# Patient Record
Sex: Female | Born: 1995 | Race: Black or African American | Hispanic: No | Marital: Single | Smoking: Never smoker
Health system: Southern US, Community
[De-identification: ages and names within clinical notes are randomized; demographics above are authoritative.]

## PROBLEM LIST (undated history)

## (undated) DIAGNOSIS — K219 Gastro-esophageal reflux disease without esophagitis: Secondary | ICD-10-CM

---

## 2016-05-21 ENCOUNTER — Encounter (HOSPITAL_COMMUNITY): Payer: Self-pay | Admitting: Emergency Medicine

## 2016-05-21 ENCOUNTER — Emergency Department (HOSPITAL_COMMUNITY)
Admission: EM | Admit: 2016-05-21 | Discharge: 2016-05-21 | Disposition: A | Discharge status: Home / Self Care | Payer: Medicaid Other | Attending: Dermatology | Admitting: Dermatology

## 2016-05-21 DIAGNOSIS — Z046 Encounter for general psychiatric examination, requested by authority: Secondary | ICD-10-CM | POA: Diagnosis present

## 2016-05-21 DIAGNOSIS — Z5321 Procedure and treatment not carried out due to patient leaving prior to being seen by health care provider: Secondary | ICD-10-CM | POA: Insufficient documentation

## 2016-05-21 HISTORY — DX: Gastro-esophageal reflux disease without esophagitis: K21.9

## 2016-05-21 NOTE — ED Notes (Signed)
Registration contacted Triage to inform RN pt and family decided to leave.

## 2016-05-21 NOTE — ED Triage Notes (Signed)
Mother states pt has become paranoid and anxious, thinks people are out to get here and steal her information. Police have been contacted, no evidence of a crime. Pt ran out of the house barefooted and into the woods yesterday. Police had to be contacted to bring pt back to home. Mother states pt has not been eating.

## 2016-05-22 ENCOUNTER — Encounter (HOSPITAL_COMMUNITY): Payer: Self-pay | Admitting: Emergency Medicine

## 2016-05-22 ENCOUNTER — Emergency Department (HOSPITAL_COMMUNITY)
Admission: EM | Admit: 2016-05-22 | Discharge: 2016-05-23 | Disposition: A | Discharge status: Other Acute Inpatient Hospital | Payer: Medicaid Other | Attending: Emergency Medicine | Admitting: Emergency Medicine

## 2016-05-22 DIAGNOSIS — Z79899 Other long term (current) drug therapy: Secondary | ICD-10-CM | POA: Insufficient documentation

## 2016-05-22 DIAGNOSIS — F22 Delusional disorders: Secondary | ICD-10-CM | POA: Diagnosis present

## 2016-05-22 DIAGNOSIS — F209 Schizophrenia, unspecified: Secondary | ICD-10-CM | POA: Insufficient documentation

## 2016-05-22 DIAGNOSIS — F29 Unspecified psychosis not due to a substance or known physiological condition: Secondary | ICD-10-CM

## 2016-05-22 LAB — RAPID URINE DRUG SCREEN, HOSP PERFORMED
AMPHETAMINES: NOT DETECTED
BENZODIAZEPINES: NOT DETECTED
Barbiturates: NOT DETECTED
Cocaine: NOT DETECTED
OPIATES: NOT DETECTED
Tetrahydrocannabinol: NOT DETECTED

## 2016-05-22 LAB — CBC WITH DIFFERENTIAL/PLATELET
Basophils Absolute: 0 10*3/uL (ref 0.0–0.1)
Basophils Relative: 0 %
EOS ABS: 0.2 10*3/uL (ref 0.0–0.7)
Eosinophils Relative: 3 %
HCT: 41.6 % (ref 36.0–46.0)
Hemoglobin: 13.7 g/dL (ref 12.0–15.0)
LYMPHS ABS: 2 10*3/uL (ref 0.7–4.0)
LYMPHS PCT: 28 %
MCH: 30.3 pg (ref 26.0–34.0)
MCHC: 32.9 g/dL (ref 30.0–36.0)
MCV: 92 fL (ref 78.0–100.0)
MONO ABS: 0.3 10*3/uL (ref 0.1–1.0)
MONOS PCT: 4 %
Neutro Abs: 4.5 10*3/uL (ref 1.7–7.7)
Neutrophils Relative %: 65 %
PLATELETS: 282 10*3/uL (ref 150–400)
RBC: 4.52 MIL/uL (ref 3.87–5.11)
RDW: 14.2 % (ref 11.5–15.5)
WBC: 7 10*3/uL (ref 4.0–10.5)

## 2016-05-22 LAB — COMPREHENSIVE METABOLIC PANEL WITH GFR
ALT: 16 U/L (ref 14–54)
AST: 31 U/L (ref 15–41)
Albumin: 4.8 g/dL (ref 3.5–5.0)
Alkaline Phosphatase: 37 U/L — ABNORMAL LOW (ref 38–126)
Anion gap: 9 (ref 5–15)
BUN: 11 mg/dL (ref 6–20)
CO2: 25 mmol/L (ref 22–32)
Calcium: 9.4 mg/dL (ref 8.9–10.3)
Chloride: 103 mmol/L (ref 101–111)
Creatinine, Ser: 0.79 mg/dL (ref 0.44–1.00)
GFR calc Af Amer: >60 mL/min
GFR calc non Af Amer: >60 mL/min
Glucose, Bld: 77 mg/dL (ref 65–99)
Potassium: 3.6 mmol/L (ref 3.5–5.1)
Sodium: 137 mmol/L (ref 135–145)
Total Bilirubin: 0.6 mg/dL (ref 0.3–1.2)
Total Protein: 8 g/dL (ref 6.5–8.1)

## 2016-05-22 LAB — ETHANOL: Alcohol, Ethyl (B): <5 mg/dL

## 2016-05-22 LAB — PREGNANCY, URINE: Preg Test, Ur: NEGATIVE

## 2016-05-22 MED ORDER — LORAZEPAM 1 MG PO TABS: 1.0000 mg | ORAL_TABLET | Freq: Three times a day (TID) | ORAL | Status: DC | PRN

## 2016-05-22 MED ORDER — LORAZEPAM 0.5 MG PO TABS
0.5000 mg | ORAL_TABLET | Freq: Three times a day (TID) | ORAL | Status: DC | PRN
  Administered 2016-05-22: 0.5 mg via ORAL
  Filled 2016-05-22: qty 1

## 2016-05-22 MED ORDER — ALUM & MAG HYDROXIDE-SIMETH 200-200-20 MG/5ML PO SUSP: 30.0000 mL | ORAL | Status: DC | PRN

## 2016-05-22 MED ORDER — ACETAMINOPHEN 325 MG PO TABS: 650.0000 mg | ORAL_TABLET | ORAL | Status: DC | PRN

## 2016-05-22 MED ORDER — IBUPROFEN 400 MG PO TABS: 600.0000 mg | ORAL_TABLET | Freq: Three times a day (TID) | ORAL | Status: DC | PRN

## 2016-05-22 NOTE — BH Assessment (Addendum)
Tele Assessment Note   Stacie FolkKaren Douglas is an 20 y.o. female, who presents voluntarily and unaccompanied to [facility]. Pt was a poor historian with psychosis. Pt could not recall the reason for her visit to [facility]. Clinican asked the pt if she has experienced, SI, or HI, clinician observed the pt's body begin to shake. Pt reported, she was "ok." Pt reported, she has been seeing shadows and people sometimes, for the past five or six days. Clinician read the pt information from the doctors note, pt agreed with information, pt "feels' someone is trying to get her information, and the other days she ran out of the house barefoot into the woods and was brought back home by police. Pt agreed, with the doctor note staying that demons were trying to get her. Pt denied current stressors. Pt denied SI, HI and self-injurious behaviors.   Per Dr. Clarene DukeMcManus' note: "Pt was seen at 1445. Per pt and her mother, c/o gradual onset and worsening of persistent "paranoia" and "anxiety" for the past 5 to 6 days. Pt's mother states pt has not been sleeping or eating. Pt "thinks people are out to get her and steal her information." Pt ran out of the house into the woods 2 days ago and Police brought pt home. Pt's mother [facility], has f/u appointment in January. Pt's mother states pt was "up all night yelling that a demon was trying to eat her." Pt also tried to get out of the moving car today. Pt denies SI/SA, states she "was just trying to get away."   Pt denied experiencing verbal, physical and sexual abuse. Pt denied being linked to OPT resources (medication management and/or counseling) however, per pt's doctor's note, [facility] and has an appointment scheduled for January 2018. Pt denied previous inpatient admissions.   Pt presented in scrubs with soft speech. Pt's eye cont was poor. Pt's mood was anxious. Pt's affect was flat. Pt's thought process was circumstantial. Pt's judgement was  impaired. Pt's concentration, insight and impulse control was poor. During the assessment the pt appeared to be responding to internal stimuli or experiencing delusional thought content. Pt reported, if discharged from [facility] she would contract for safety. Pt reported, if inpatient treatment is recommended she will sign in voluntarily.   Diagnosis: Unspecified schizophrenia spectrum and other psychotic disorder.   Past Medical History:  Past Medical History:  Diagnosis Date  . GERD (gastroesophageal reflux disease)     History reviewed. No pertinent surgical history.  Family History:  Family History  Problem Relation Age of Onset  . Autism Sister   . Autism Brother     Social History:  reports that she has never smoked. She has never used smokeless tobacco. She reports that she does not drink alcohol or use drugs.  Additional Social History:  Alcohol / Drug Use Pain Medications: See MAR  Prescriptions: See MAR Over the Counter: See MAR History of alcohol / drug use?: No history of alcohol / drug abuse  CIWA: CIWA-Ar BP: 105/74 Pulse Rate: 106 COWS:    PATIENT STRENGTHS: (choose at least two) Average or above average intelligence Supportive family/friends  Allergies: No Known Allergies  Home Medications:  (Not in a hospital admission)  OB/GYN Status:  Patient's last menstrual period was 05/19/2016.  General Assessment Data Location of Assessment: [facility] TTS Assessment: In system Is this a Tele or Face-to-Face Assessment?: Tele Assessment Is this an Initial Assessment or a Re-assessment for  this encounter?: Initial Assessment Marital status: Single Maiden name: NA Is patient pregnant?: No Pregnancy Status: No Living Arrangements: Parent Can pt return to current living arrangement?: Yes Admission Status: Voluntary Is patient capable of signing voluntary admission?: Yes Referral Source: Self/Family/Friend Insurance type: Medicaid     Crisis Care Plan Living  Arrangements: Parent Legal Guardian: Other: (Self) Name of Psychiatrist: NA Name of Therapist: NA  Education Status Is patient currently in school?: No Current Grade: NA Highest grade of school patient has completed: 11th grade Name of school: NA Contact person: NA  Risk to self with the past 6 months Suicidal Ideation: No (Pt denies. ) Has patient been a risk to self within the past 6 months prior to admission? : No Suicidal Intent: No Has patient had any suicidal intent within the past 6 months prior to admission? : No Is patient at risk for suicide?: No Suicidal Plan?: No Has patient had any suicidal plan within the past 6 months prior to admission? : No Access to Means: No What has been your use of drugs/alcohol within the last 12 months?: NA Previous Attempts/Gestures: No How many times?: 0 Other Self Harm Risks: NA Triggers for Past Attempts: None known Intentional Self Injurious Behavior: None (Pt denies. ) Family Suicide History: Unable to assess Recent stressful life event(s): Other (Comment) (Pt denies, stressors.) Persecutory voices/beliefs?: No Depression: No Substance abuse history and/or treatment for substance abuse?: No Suicide prevention information given to non-admitted patients: Not applicable  Risk to Others within the past 6 months Homicidal Ideation: No (Pt denies. ) Does patient have any lifetime risk of violence toward others beyond the six months prior to admission? : No Thoughts of Harm to Others: No Current Homicidal Intent: No Current Homicidal Plan: No Access to Homicidal Means: No Identified Victim: NA History of harm to others?: No Assessment of Violence: None Noted Violent Behavior Description: NA Does patient have access to weapons?: No (Pt denies. ) Criminal Charges Pending?: No (Pt denies. ) Does patient have a court date: No (Pt denies. ) Is patient on probation?: No (Pt denies. )  Psychosis Hallucinations: Visual Delusions:  Unspecified  Mental Status Report Appearance/Hygiene: In scrubs Eye Contact: Poor Motor Activity: Unremarkable Speech: Soft Level of Consciousness: Alert Mood: Anxious Affect: Flat Anxiety Level: Moderate Thought Processes: Circumstantial Judgement: Impaired Orientation: Other (Comment) (date, year, city and state) Obsessive Compulsive Thoughts/Behaviors: Unable to Assess  Cognitive Functioning Concentration: Poor Memory: Recent Impaired IQ: Average Insight: Poor Impulse Control: Poor Appetite: Poor Weight Loss: 0 Weight Gain: 0 Sleep: Decreased Total Hours of Sleep: 6 Vegetative Symptoms: None  ADLScreening [facility] Assessment Services) Patient's cognitive ability adequate to safely complete daily activities?: Yes Patient able to express need for assistance with ADLs?: Yes Independently performs ADLs?: Yes (appropriate for developmental age)  Prior Inpatient Therapy Prior Inpatient Therapy: No Prior Therapy Dates: NA Prior Therapy Facilty/Provider(s): NA Reason for Treatment: NA  Prior Outpatient Therapy Prior Outpatient Therapy: No Prior Therapy Dates: NA Prior Therapy Facilty/Provider(s): NA Reason for Treatment: NA Does patient have an ACCT team?: No Does patient have Intensive In-House Services?  : No Does patient have [facility] services? : No Does patient have P4CC services?: No  ADL Screening (condition at time of admission) Patient's cognitive ability adequate to safely complete daily activities?: Yes Is the patient deaf or have difficulty hearing?: No Does the patient have difficulty seeing, even when wearing glasses/contacts?: Yes (Pt wears glasses. ) Does the patient have difficulty concentrating, remembering, or making decisions?: Yes (  Pt has difficulty concentrating. ) Patient able to express need for assistance with ADLs?: Yes Does the patient have difficulty dressing or bathing?: No Independently performs ADLs?: Yes (appropriate for developmental  age) Does the patient have difficulty walking or climbing stairs?: No Weakness of Legs: None Weakness of Arms/Hands: None       Abuse/Neglect Assessment (Assessment to be complete while patient is alone) Physical Abuse: Denies (Pt denies. ) Verbal Abuse: Denies (Pt denies. ) Sexual Abuse: Denies (Pt denies. )     Advance Directives (For Healthcare) Does Patient Have a Medical Advance Directive?: No Would patient like information on creating a medical advance directive?: No - Patient declined    Additional Information 1:1 In Past 12 Months?: No CIRT Risk: No Elopement Risk: No Does patient have medical clearance?: Yes     Disposition: Nira ConnJason Berry, NP recommended inpatient treatment. Discussed with disposition with Barbara CowerJason, RN. Barbara CowerJason, RN reported, he will inform the doctor of the disposition. TTS to seek placement. Disposition Initial Assessment Completed for this Encounter: Yes Disposition of Patient: Inpatient treatment program Type of inpatient treatment program: Adult  Gwinda Passereylese D Bennett 05/22/2016 9:10 PM   Gwinda Passereylese D Bennett, MS, [facility], [facility] Triage Specialist [phone]

## 2016-05-22 NOTE — ED Provider Notes (Signed)
[facility]ER[facility]Y DEPT Provider Note   CSN: [id number] Arrival date & time: 05/22/16  1416     History   Chief Complaint Chief Complaint  Patient presents with  . V70.1    HPI Barrie FolkKaren Casasola is a 20 y.o. female.  HPI  Pt was seen at 1445. Per pt and her mother, c/o gradual onset and worsening of persistent "paranoia" and "anxiety" for the past 5 to 6 days. Pt's mother states pt has not been sleeping or eating. Pt "thinks people are out to get her and steal her information." Pt ran out of the house into the woods 2 days ago and Police brought pt home. Pt's mother [facility], has f/u appointment in January. Pt's mother states pt was "up all night yelling that a demon was trying to eat her." Pt also tried to get out of the moving car today. Pt denies SI/SA, states she "was just trying to get away."   Past Medical History:  Diagnosis Date  . GERD (gastroesophageal reflux disease)     There are no active problems to display for this patient.   History reviewed. No pertinent surgical history.  OB History    Gravida Para Term Preterm AB Living             0   SAB TAB Ectopic Multiple Live Births                   Home Medications    Prior to Admission medications   Not on File    Family History Family History  Problem Relation Age of Onset  . Autism Sister   . Autism Brother     Social History Social History  Substance Use Topics  . Smoking status: Never Smoker  . Smokeless tobacco: Never Used  . Alcohol use No     Allergies   Patient has no known allergies.   Review of Systems Review of Systems ROS: Statement: All systems negative except as marked or noted in the HPI; Constitutional: Negative for fever and chills. ; ; Eyes: Negative for eye pain, redness and discharge. ; ; ENMT: Negative for ear pain, hoarseness, nasal congestion, sinus pressure and sore throat. ; ; Cardiovascular: Negative for chest pain, palpitations,  diaphoresis, dyspnea and peripheral edema. ; ; Respiratory: Negative for cough, wheezing and stridor. ; ; Gastrointestinal: Negative for nausea, vomiting, diarrhea, abdominal pain, blood in stool, hematemesis, jaundice and rectal bleeding. . ; ; Genitourinary: Negative for dysuria, flank pain and hematuria. ; ; Musculoskeletal: Negative for back pain and neck pain. Negative for swelling and trauma.; ; Skin: Negative for pruritus, rash, abrasions, blisters, bruising and skin lesion.; ; Neuro: Negative for headache, lightheadedness and neck stiffness. Negative for weakness, altered level of consciousness, altered mental status, extremity weakness, paresthesias, involuntary movement, seizure and syncope.; Psych:  No SI, no SA, no HI, +paranoia, hallucinations.        Physical Exam Updated Vital Signs BP 105/74 (BP Location: Left Arm)   Pulse 106   Temp 97.8 F (36.6 C) (Oral)   Resp 16   Ht 5\' 4"  (1.626 m)   Wt 86 lb 4 oz (39.1 kg)   LMP 05/19/2016   SpO2 100%   BMI 14.80 kg/m   Physical Exam 1450: Physical examination:  Nursing notes reviewed; Vital signs and O2 SAT reviewed;  Constitutional: Well developed, Well nourished, Well hydrated, In no acute distress; Head:  Normocephalic, atraumatic; Eyes: EOMI, PERRL, No scleral icterus;  ENMT: Mouth and pharynx normal, Mucous membranes moist; Neck: Supple, Full range of motion; Cardiovascular: Regular rate and rhythm; Respiratory: Breath sounds clear, No wheezes.  Speaking full sentences with ease, Normal respiratory effort/excursion; Chest: No deformity, Movement normal; Abdomen: Nondistended; Extremities: No deformity.; Neuro: AA&Ox3, Major CN grossly intact.  Speech clear. No gross focal motor deficits in extremities. Climbs on and off stretcher easily by herself. Gait steady.; Skin: Color normal, Warm, Dry.; Psych:  Affect flat. Appears paranoid and to be responding to internal stimuli.   ED Treatments / Results  Labs (all labs ordered are  listed, but only abnormal results are displayed)   EKG  EKG Interpretation None       Radiology   Procedures Procedures (including critical care time)  Medications Ordered in ED Medications - No data to display   Initial Impression / Assessment and Plan / ED Course  I have reviewed the triage vital signs and the nursing notes.  Pertinent labs & imaging results that were available during my care of the patient were reviewed by me and considered in my medical decision making (see chart for details).  MDM Reviewed: nursing note, vitals and previous chart Interpretation: labs   Results for orders placed or performed during the hospital encounter of 05/22/16  Urine rapid drug screen (hosp performed)  Result Value Ref Range   Opiates NONE DETECTED NONE DETECTED   Cocaine NONE DETECTED NONE DETECTED   Benzodiazepines NONE DETECTED NONE DETECTED   Amphetamines NONE DETECTED NONE DETECTED   Tetrahydrocannabinol NONE DETECTED NONE DETECTED   Barbiturates NONE DETECTED NONE DETECTED  Pregnancy, urine  Result Value Ref Range   Preg Test, Ur NEGATIVE NEGATIVE  Comprehensive metabolic panel  Result Value Ref Range   Sodium 137 135 - 145 mmol/L   Potassium 3.6 3.5 - 5.1 mmol/L   Chloride 103 101 - 111 mmol/L   CO2 25 22 - 32 mmol/L   Glucose, Bld 77 65 - 99 mg/dL   BUN 11 6 - 20 mg/dL   Creatinine, Ser 1.61 0.44 - 1.00 mg/dL   Calcium 9.4 8.9 - 09.6 mg/dL   Total Protein 8.0 6.5 - 8.1 g/dL   Albumin 4.8 3.5 - 5.0 g/dL   AST 31 15 - 41 U/L   ALT 16 14 - 54 U/L   Alkaline Phosphatase 37 (L) 38 - 126 U/L   Total Bilirubin 0.6 0.3 - 1.2 mg/dL   GFR calc non Af Amer >60 >60 mL/min   GFR calc Af Amer >60 >60 mL/min   Anion gap 9 5 - 15  Ethanol  Result Value Ref Range   Alcohol, Ethyl (B) <5 <5 mg/dL  CBC with Differential  Result Value Ref Range   WBC 7.0 4.0 - 10.5 K/uL   RBC 4.52 3.87 - 5.11 MIL/uL   Hemoglobin 13.7 12.0 - 15.0 g/dL   HCT 04.5 40.9 - 81.1 %   MCV  92.0 78.0 - 100.0 fL   MCH 30.3 26.0 - 34.0 pg   MCHC 32.9 30.0 - 36.0 g/dL   RDW 91.4 78.2 - 95.6 %   Platelets 282 150 - 400 K/uL   Neutrophils Relative % 65 %   Neutro Abs 4.5 1.7 - 7.7 K/uL   Lymphocytes Relative 28 %   Lymphs Abs 2.0 0.7 - 4.0 K/uL   Monocytes Relative 4 %   Monocytes Absolute 0.3 0.1 - 1.0 K/uL   Eosinophils Relative 3 %   Eosinophils Absolute 0.2 0.0 - 0.7 K/uL  Basophils Relative 0 %   Basophils Absolute 0.0 0.0 - 0.1 K/uL    1945:  TTS to evaluate.   2100:  TTS has evaluated pt: recommends inpt treatment, placement pending. Pt attempting to leave. IVC paperwork completed. Holding orders written.     Final Clinical Impressions(s) / ED Diagnoses   Final diagnoses:  None    New Prescriptions New Prescriptions   No medications on file     Samuel JesterKathleen Taisia Fantini, DO 05/22/16 2119

## 2016-05-22 NOTE — ED Notes (Signed)
Notified Dr. Clarene DukeMcmanus that pt was attempting to leave.  Security currently standing at door preventing pt from leaving.  I instructed the patient to be calm and wait to speak with EDP.

## 2016-05-22 NOTE — ED Notes (Signed)
Pt given meal tray.

## 2016-05-22 NOTE — ED Notes (Signed)
[facility] and will try to find placement outside [facility] as they are at [facility].  If placement cannot be found tonight, Thurston Poundsrey says [facility] and bed [facility] sometime tomorrow.

## 2016-05-22 NOTE — ED Triage Notes (Signed)
[facility], has follow-up appt in Jan. Mother reports last night pt was up yelling, stating a demon was trying to eat her. [facility] the pt tried to get out of the car while it was moving. Pt refusing to eat, not sleeping.

## 2016-05-22 NOTE — ED Notes (Signed)
Mothers phone number:  Carney LivingJacqueline Douglas [phone]

## 2016-05-22 NOTE — ED Notes (Signed)
Patient could not use restroom. She will notify sitter when she can.

## 2016-05-22 NOTE — BHH Counselor (Signed)
At 2026, clinician called pt's mother to gather further information. Clinician left a HIPPA compliant message.   Gwinda Passereylese D Bennett, MS, [facility], [facility] Triage Specialist [phone]

## 2016-05-23 NOTE — ED Notes (Signed)
Attempted to contact patient's mother again at [phone] and [phone] without success.

## 2016-05-23 NOTE — ED Notes (Signed)
Pt provided with breakfast tray.  States "I feel good this morning."  Pt very withdrawn and quiet.  Does not speak much.  Provided with breakfast tray. Sitter at bedside.

## 2016-05-23 NOTE — ED Notes (Signed)
Attempted to contact patient's mother again at telephone [phone].  No answer at this time.

## 2016-05-23 NOTE — Progress Notes (Signed)
Reviewed chart, pt was assessed by TTS and recommended to receive inpatient treatment, referred to behavioral health hospitals. [facility] called and requested to know if pt has an autism dx "b/c 2 of her siblings do."   CSW called pt's mother Carney LivingJacqueline Wichmann [phone]. She states pt does not have autism and has never been dx with any mental health d/o prior to this incident. Does state 2 siblings are dx with autism, denies any other family hx of mental illness other than "maybe her grandparents as they got into their 10670s, had some problems." States pt has never seen a MH provider and is not prescribed medications for MG. Patient lives with mother and siblings.   [facility] to inform [facility]. Alecia LemmingVictor states [facility] is [facility] by Dr. Estill Cottahomas Cornwall, can arrive anytime and report # is [phone].  Informed [facility]. Pt under IVC. Informed pt's mother and provided contact number to [facility] so that family can be in touch with treatment team once pt admitted.  Ilean SkillMeghan Danial Hlavac, MSW, LCSW Clinical Social Work, Disposition  05/23/2016 [phone]

## 2016-05-23 NOTE — ED Notes (Signed)
Report given to Howard PouchVictor Lancaster at [facility].

## 2016-05-23 NOTE — BHH Counselor (Addendum)
Clinician received a call from Lakeland Northarol at admissions from [facility]. Okey RegalCarol wanted to know if pt was diagnosed with Autism. Clinician reported, she did not see a diagnoses of Autism in the pt's chart. Clinician reported, she has contacted the pt's mother earlier to gather additional and has not received a call back. Clinician contacted Darl PikesSusan, RN at [facility] to gather additional information it was noted from Darl PikesSusan, that she did not see an Autism diagnoses in pt's chart. Clinician discussed findings with Okey RegalCarol at [facility]. Okey RegalCarol, requested if pt's mother could be contacted to confirm or deny Autism diagnoses. Okey RegalCarol reported, she was going to hold a bed until the end of her shift at 0800.   Gwinda Passereylese D Bennett, MS, [facility], [facility] Triage Specialist [phone]

## 2016-05-23 NOTE — ED Provider Notes (Signed)
  Physical Exam  BP 122/77 (BP Location: Right Arm)   Pulse 94   Temp 97.5 F (36.4 C) (Oral)   Resp 20   Ht 5\' 4"  (1.626 m)   Wt 86 lb 4 oz (39.1 kg)   LMP 05/19/2016   SpO2 100%   BMI 14.80 kg/m   Physical Exam  ED Course  Procedures  MDM Accepted [facility] by Dr Benjiman Coreornwall       Kemaya Dorner, MD 05/23/16 [phone]

## 2016-05-23 NOTE — BHH Counselor (Signed)
Pt's referral was sent to the following inpatient facilities:   [facility]  TTS will continue to seek placement.   Gwinda Passereylese D Bennett, MS, [facility], [facility] Triage Specialist [phone]

## 2016-05-23 NOTE — ED Notes (Signed)
Attempted to call patient's mother, she did not answer at this time.  Patient has siblings that are diagnosed with autism and [facility] would like to know if the patient has the same diagnosed with the same.

## 2016-07-31 ENCOUNTER — Ambulatory Visit: Payer: Medicaid Other | Admitting: Family Medicine

## 2016-11-20 ENCOUNTER — Encounter (INDEPENDENT_AMBULATORY_CARE_PROVIDER_SITE_OTHER): Payer: Self-pay | Admitting: Ophthalmology

## 2016-12-11 ENCOUNTER — Encounter (INDEPENDENT_AMBULATORY_CARE_PROVIDER_SITE_OTHER): Payer: Self-pay | Admitting: Ophthalmology

## 2017-03-22 ENCOUNTER — Emergency Department (HOSPITAL_COMMUNITY): Payer: Medicaid Other

## 2017-03-22 ENCOUNTER — Inpatient Hospital Stay (HOSPITAL_COMMUNITY): Payer: Medicaid Other

## 2017-03-22 ENCOUNTER — Inpatient Hospital Stay (HOSPITAL_COMMUNITY)
Admission: EM | Admit: 2017-03-22 | Discharge: 2017-03-25 | DRG: 917 | Disposition: A | Discharge status: Other Acute Inpatient Hospital | Payer: Medicaid Other | Attending: Internal Medicine | Admitting: Internal Medicine

## 2017-03-22 ENCOUNTER — Encounter (HOSPITAL_COMMUNITY): Payer: Self-pay | Admitting: *Deleted

## 2017-03-22 DIAGNOSIS — G92 Toxic encephalopathy: Secondary | ICD-10-CM | POA: Diagnosis present

## 2017-03-22 DIAGNOSIS — J9600 Acute respiratory failure, unspecified whether with hypoxia or hypercapnia: Secondary | ICD-10-CM | POA: Diagnosis present

## 2017-03-22 DIAGNOSIS — R509 Fever, unspecified: Secondary | ICD-10-CM | POA: Diagnosis present

## 2017-03-22 DIAGNOSIS — E162 Hypoglycemia, unspecified: Secondary | ICD-10-CM | POA: Diagnosis present

## 2017-03-22 DIAGNOSIS — K219 Gastro-esophageal reflux disease without esophagitis: Secondary | ICD-10-CM | POA: Diagnosis present

## 2017-03-22 DIAGNOSIS — F17290 Nicotine dependence, other tobacco product, uncomplicated: Secondary | ICD-10-CM | POA: Diagnosis present

## 2017-03-22 DIAGNOSIS — T1491XA Suicide attempt, initial encounter: Secondary | ICD-10-CM | POA: Diagnosis not present

## 2017-03-22 DIAGNOSIS — R9431 Abnormal electrocardiogram [ECG] [EKG]: Secondary | ICD-10-CM | POA: Diagnosis present

## 2017-03-22 DIAGNOSIS — T450X1A Poisoning by antiallergic and antiemetic drugs, accidental (unintentional), initial encounter: Secondary | ICD-10-CM | POA: Diagnosis present

## 2017-03-22 DIAGNOSIS — T450X2A Poisoning by antiallergic and antiemetic drugs, intentional self-harm, initial encounter: Principal | ICD-10-CM | POA: Diagnosis present

## 2017-03-22 DIAGNOSIS — R569 Unspecified convulsions: Secondary | ICD-10-CM | POA: Diagnosis present

## 2017-03-22 DIAGNOSIS — I471 Supraventricular tachycardia: Secondary | ICD-10-CM | POA: Diagnosis present

## 2017-03-22 DIAGNOSIS — Z818 Family history of other mental and behavioral disorders: Secondary | ICD-10-CM

## 2017-03-22 DIAGNOSIS — Z810 Family history of intellectual disabilities: Secondary | ICD-10-CM | POA: Diagnosis not present

## 2017-03-22 DIAGNOSIS — D72829 Elevated white blood cell count, unspecified: Secondary | ICD-10-CM | POA: Diagnosis present

## 2017-03-22 DIAGNOSIS — F3130 Bipolar disorder, current episode depressed, mild or moderate severity, unspecified: Secondary | ICD-10-CM | POA: Diagnosis not present

## 2017-03-22 DIAGNOSIS — G4700 Insomnia, unspecified: Secondary | ICD-10-CM | POA: Diagnosis not present

## 2017-03-22 DIAGNOSIS — D649 Anemia, unspecified: Secondary | ICD-10-CM | POA: Diagnosis present

## 2017-03-22 DIAGNOSIS — J9601 Acute respiratory failure with hypoxia: Secondary | ICD-10-CM | POA: Diagnosis present

## 2017-03-22 DIAGNOSIS — F329 Major depressive disorder, single episode, unspecified: Secondary | ICD-10-CM | POA: Diagnosis present

## 2017-03-22 DIAGNOSIS — E876 Hypokalemia: Secondary | ICD-10-CM | POA: Diagnosis present

## 2017-03-22 DIAGNOSIS — T39312A Poisoning by propionic acid derivatives, intentional self-harm, initial encounter: Secondary | ICD-10-CM | POA: Diagnosis present

## 2017-03-22 DIAGNOSIS — T50904A Poisoning by unspecified drugs, medicaments and biological substances, undetermined, initial encounter: Secondary | ICD-10-CM

## 2017-03-22 DIAGNOSIS — E872 Acidosis: Secondary | ICD-10-CM | POA: Diagnosis present

## 2017-03-22 DIAGNOSIS — F39 Unspecified mood [affective] disorder: Secondary | ICD-10-CM | POA: Diagnosis not present

## 2017-03-22 LAB — PHOSPHORUS
Phosphorus: 1.8 mg/dL — ABNORMAL LOW (ref 2.5–4.6)
Phosphorus: 2.4 mg/dL — ABNORMAL LOW (ref 2.5–4.6)
Phosphorus: 2.2 mg/dL — ABNORMAL LOW (ref 2.5–4.6)

## 2017-03-22 LAB — GLUCOSE, CAPILLARY
GLUCOSE-CAPILLARY: 104 mg/dL — AB (ref 65–99)
GLUCOSE-CAPILLARY: 261 mg/dL — AB (ref 65–99)
GLUCOSE-CAPILLARY: 48 mg/dL — AB (ref 65–99)
Glucose-Capillary: 111 mg/dL — ABNORMAL HIGH (ref 65–99)
Glucose-Capillary: 163 mg/dL — ABNORMAL HIGH (ref 65–99)
Glucose-Capillary: 41 mg/dL — CL (ref 65–99)
Glucose-Capillary: 63 mg/dL — ABNORMAL LOW (ref 65–99)
Glucose-Capillary: 65 mg/dL (ref 65–99)
Glucose-Capillary: 68 mg/dL (ref 65–99)
Glucose-Capillary: 99 mg/dL (ref 65–99)

## 2017-03-22 LAB — COMPREHENSIVE METABOLIC PANEL
ALBUMIN: 3.9 g/dL (ref 3.5–5.0)
ALK PHOS: 44 U/L (ref 38–126)
ALT: 12 U/L — ABNORMAL LOW (ref 14–54)
ALT: 14 U/L (ref 14–54)
ANION GAP: 16 — AB (ref 5–15)
AST: 29 U/L (ref 15–41)
AST: 26 U/L (ref 15–41)
Albumin: 4.2 g/dL (ref 3.5–5.0)
Alkaline Phosphatase: 44 U/L (ref 38–126)
Anion gap: 8 (ref 5–15)
BUN: 6 mg/dL (ref 6–20)
CHLORIDE: 102 mmol/L (ref 101–111)
CHLORIDE: 107 mmol/L (ref 101–111)
CO2: 24 mmol/L (ref 22–32)
CO2: 24 mmol/L (ref 22–32)
CREATININE: 0.77 mg/dL (ref 0.44–1.00)
Calcium: 8.4 mg/dL — ABNORMAL LOW (ref 8.9–10.3)
Calcium: 7.8 mg/dL — ABNORMAL LOW (ref 8.9–10.3)
Creatinine, Ser: 0.72 mg/dL (ref 0.44–1.00)
GFR calc non Af Amer: >60 mL/min (ref >60)
GLUCOSE: 123 mg/dL — AB (ref 65–99)
Glucose, Bld: 104 mg/dL — ABNORMAL HIGH (ref 65–99)
POTASSIUM: 3.4 mmol/L — AB (ref 3.5–5.1)
Potassium: 3.4 mmol/L — ABNORMAL LOW (ref 3.5–5.1)
SODIUM: 142 mmol/L (ref 135–145)
SODIUM: 139 mmol/L (ref 135–145)
TOTAL PROTEIN: 7 g/dL (ref 6.5–8.1)
Total Bilirubin: 0.2 mg/dL — ABNORMAL LOW (ref 0.3–1.2)
Total Bilirubin: 0.7 mg/dL (ref 0.3–1.2)
Total Protein: 6.7 g/dL (ref 6.5–8.1)

## 2017-03-22 LAB — I-STAT CHEM 8, ED
BUN: <3 mg/dL — ABNORMAL LOW (ref 6–20)
CREATININE: 0.5 mg/dL (ref 0.44–1.00)
Calcium, Ion: 0.95 mmol/L — ABNORMAL LOW (ref 1.15–1.40)
Chloride: 99 mmol/L — ABNORMAL LOW (ref 101–111)
Glucose, Bld: 125 mg/dL — ABNORMAL HIGH (ref 65–99)
HEMATOCRIT: 37 % (ref 36.0–46.0)
HEMOGLOBIN: 12.6 g/dL (ref 12.0–15.0)
POTASSIUM: 3.2 mmol/L — AB (ref 3.5–5.1)
SODIUM: 143 mmol/L (ref 135–145)
TCO2: 25 mmol/L (ref 22–32)

## 2017-03-22 LAB — CBC WITH DIFFERENTIAL/PLATELET
BASOS PCT: 0 %
Basophils Absolute: 0 10*3/uL (ref 0.0–0.1)
Basophils Absolute: 0 10*3/uL (ref 0.0–0.1)
Basophils Relative: 0 %
EOS ABS: 0.1 10*3/uL (ref 0.0–0.7)
EOS PCT: 1 %
Eosinophils Absolute: 0 10*3/uL (ref 0.0–0.7)
Eosinophils Relative: 0 %
HCT: 37.1 % (ref 36.0–46.0)
HEMATOCRIT: 36.8 % (ref 36.0–46.0)
HEMOGLOBIN: 12.7 g/dL (ref 12.0–15.0)
HEMOGLOBIN: 12.8 g/dL (ref 12.0–15.0)
LYMPHS ABS: 1.7 10*3/uL (ref 0.7–4.0)
LYMPHS ABS: 1.1 10*3/uL (ref 0.7–4.0)
LYMPHS PCT: 18 %
Lymphocytes Relative: 9 %
MCH: 30.2 pg (ref 26.0–34.0)
MCH: 31.1 pg (ref 26.0–34.0)
MCHC: 34.2 g/dL (ref 30.0–36.0)
MCHC: 34.8 g/dL (ref 30.0–36.0)
MCV: 88.1 fL (ref 78.0–100.0)
MCV: 89.3 fL (ref 78.0–100.0)
MONOS PCT: 3 %
MONOS PCT: 6 %
Monocytes Absolute: 0.3 10*3/uL (ref 0.1–1.0)
Monocytes Absolute: 0.8 10*3/uL (ref 0.1–1.0)
NEUTROS ABS: 10.5 10*3/uL — AB (ref 1.7–7.7)
NEUTROS PCT: 85 %
Neutro Abs: 7.2 10*3/uL (ref 1.7–7.7)
Neutrophils Relative %: 78 %
PLATELETS: 235 10*3/uL (ref 150–400)
Platelets: 230 10*3/uL (ref 150–400)
RBC: 4.21 MIL/uL (ref 3.87–5.11)
RBC: 4.12 MIL/uL (ref 3.87–5.11)
RDW: 12.6 % (ref 11.5–15.5)
RDW: 13 % (ref 11.5–15.5)
WBC: 9.2 10*3/uL (ref 4.0–10.5)
WBC: 12.4 10*3/uL — ABNORMAL HIGH (ref 4.0–10.5)

## 2017-03-22 LAB — BLOOD GAS, ARTERIAL
ACID-BASE DEFICIT: 1 mmol/L (ref 0.0–2.0)
Acid-Base Excess: 0.7 mmol/L (ref 0.0–2.0)
Bicarbonate: 24.6 mmol/L (ref 20.0–28.0)
Bicarbonate: 22.7 mmol/L (ref 20.0–28.0)
DRAWN BY: 382351
DRAWN BY: 505221
FIO2: 100
FIO2: 35
MECHVT: 400 mL
O2 SAT: 99.4 %
O2 Saturation: 99.7 %
PCO2 ART: 47.6 mmHg (ref 32.0–48.0)
PEEP/CPAP: 5 cmH2O
PEEP: 5 cmH2O
PH ART: 7.43 (ref 7.350–7.450)
PO2 ART: 185 mmHg — AB (ref 83.0–108.0)
Patient temperature: 37
Patient temperature: 98.6
RATE: 16 resp/min
RATE: 16 resp/min
VT: 400 mL
pCO2 arterial: 34.8 mmHg (ref 32.0–48.0)
pH, Arterial: 7.351 (ref 7.350–7.450)

## 2017-03-22 LAB — TROPONIN I
Troponin I: <0.03 ng/mL (ref <0.03)
Troponin I: 0.03 ng/mL (ref <0.03)
Troponin I: <0.03 ng/mL (ref <0.03)

## 2017-03-22 LAB — URINALYSIS, ROUTINE W REFLEX MICROSCOPIC
Bilirubin Urine: NEGATIVE
Glucose, UA: NEGATIVE mg/dL
Hgb urine dipstick: NEGATIVE
KETONES UR: NEGATIVE mg/dL
LEUKOCYTES UA: NEGATIVE
NITRITE: NEGATIVE
PROTEIN: NEGATIVE mg/dL
Specific Gravity, Urine: 1.008 (ref 1.005–1.030)
pH: 6 (ref 5.0–8.0)

## 2017-03-22 LAB — RAPID URINE DRUG SCREEN, HOSP PERFORMED
Amphetamines: NOT DETECTED
BARBITURATES: NOT DETECTED
BENZODIAZEPINES: NOT DETECTED
Cocaine: NOT DETECTED
OPIATES: NOT DETECTED
Tetrahydrocannabinol: NOT DETECTED

## 2017-03-22 LAB — MAGNESIUM
MAGNESIUM: 1.8 mg/dL (ref 1.7–2.4)
MAGNESIUM: 2.3 mg/dL (ref 1.7–2.4)
MAGNESIUM: 2.2 mg/dL (ref 1.7–2.4)
MAGNESIUM: 2.1 mg/dL (ref 1.7–2.4)

## 2017-03-22 LAB — I-STAT CG4 LACTIC ACID, ED: Lactic Acid, Venous: 9.29 mmol/L (ref 0.5–1.9)

## 2017-03-22 LAB — ETHANOL: Alcohol, Ethyl (B): <10 mg/dL (ref <10)

## 2017-03-22 LAB — I-STAT BETA HCG BLOOD, ED (MC, WL, AP ONLY): I-stat hCG, quantitative: <5 m[IU]/mL (ref <5)

## 2017-03-22 LAB — ACETAMINOPHEN LEVEL

## 2017-03-22 LAB — TRIGLYCERIDES: TRIGLYCERIDES: 40 mg/dL (ref <150)

## 2017-03-22 LAB — PROTIME-INR
INR: 1.13
Prothrombin Time: 14.4 seconds (ref 11.4–15.2)

## 2017-03-22 LAB — CK: CK TOTAL: 53 U/L (ref 38–234)

## 2017-03-22 LAB — SALICYLATE LEVEL

## 2017-03-22 LAB — LACTIC ACID, PLASMA
Lactic Acid, Venous: 2.7 mmol/L (ref 0.5–1.9)
Lactic Acid, Venous: 2 mmol/L (ref 0.5–1.9)

## 2017-03-22 LAB — MRSA PCR SCREENING: MRSA BY PCR: NEGATIVE

## 2017-03-22 MED ORDER — PROPOFOL 1000 MG/100ML IV EMUL
INTRAVENOUS | Status: AC
  Administered 2017-03-22: 5 ug/kg/min via INTRAVENOUS
  Filled 2017-03-22: qty 100

## 2017-03-22 MED ORDER — HEPARIN SODIUM (PORCINE) 5000 UNIT/ML IJ SOLN
5000.0000 [IU] | Freq: Three times a day (TID) | INTRAMUSCULAR | Status: DC
  Administered 2017-03-22 – 2017-03-25 (×10): 5000 [IU] via SUBCUTANEOUS
  Filled 2017-03-22 (×10): qty 1

## 2017-03-22 MED ORDER — ETOMIDATE 2 MG/ML IV SOLN
INTRAVENOUS | Status: AC | PRN
  Administered 2017-03-22: 20 mg via INTRAVENOUS

## 2017-03-22 MED ORDER — MIDAZOLAM HCL 2 MG/2ML IJ SOLN: 2.0000 mg | INTRAMUSCULAR | Status: DC | PRN

## 2017-03-22 MED ORDER — FENTANYL CITRATE (PF) 100 MCG/2ML IJ SOLN
100.0000 ug | INTRAMUSCULAR | Status: DC | PRN
  Administered 2017-03-23 (×3): 100 ug via INTRAVENOUS
  Filled 2017-03-22 (×4): qty 2

## 2017-03-22 MED ORDER — FAMOTIDINE IN NACL 20-0.9 MG/50ML-% IV SOLN
20.0000 mg | Freq: Two times a day (BID) | INTRAVENOUS | Status: DC
  Administered 2017-03-22 – 2017-03-23 (×3): 20 mg via INTRAVENOUS
  Filled 2017-03-22 (×3): qty 50

## 2017-03-22 MED ORDER — FENTANYL CITRATE (PF) 100 MCG/2ML IJ SOLN: 100.0000 ug | INTRAMUSCULAR | Status: DC | PRN

## 2017-03-22 MED ORDER — LEVETIRACETAM IN NACL 1000 MG/100ML IV SOLN
1000.0000 mg | Freq: Once | INTRAVENOUS | Status: AC
  Administered 2017-03-22: 1000 mg via INTRAVENOUS
  Filled 2017-03-22: qty 100

## 2017-03-22 MED ORDER — DEXTROSE-NACL 5-0.9 % IV SOLN
INTRAVENOUS | Status: DC
  Administered 2017-03-22 – 2017-03-23 (×3): via INTRAVENOUS

## 2017-03-22 MED ORDER — LORAZEPAM 2 MG/ML IJ SOLN
INTRAMUSCULAR | Status: AC
  Administered 2017-03-22: 2 mg via INTRAVENOUS
  Filled 2017-03-22: qty 1

## 2017-03-22 MED ORDER — SODIUM CHLORIDE 0.9 % IV SOLN
INTRAVENOUS | Status: DC
  Administered 2017-03-22: 08:00:00 via INTRAVENOUS

## 2017-03-22 MED ORDER — ACETAMINOPHEN 325 MG PO TABS: 650.0000 mg | ORAL_TABLET | ORAL | Status: DC | PRN

## 2017-03-22 MED ORDER — CHLORHEXIDINE GLUCONATE 0.12 % MT SOLN
15.0000 mL | Freq: Two times a day (BID) | OROMUCOSAL | Status: DC
  Administered 2017-03-23: 15 mL via OROMUCOSAL
  Filled 2017-03-22: qty 15

## 2017-03-22 MED ORDER — DEXTROSE 50 % IV SOLN
50.0000 mL | Freq: Once | INTRAVENOUS | Status: AC
  Administered 2017-03-22: 50 mL via INTRAVENOUS

## 2017-03-22 MED ORDER — DEXTROSE 50 % IV SOLN
INTRAVENOUS | Status: AC
  Filled 2017-03-22: qty 50

## 2017-03-22 MED ORDER — PROPOFOL 1000 MG/100ML IV EMUL
5.0000 ug/kg/min | Freq: Once | INTRAVENOUS | Status: AC
  Administered 2017-03-22: 5 ug/kg/min via INTRAVENOUS

## 2017-03-22 MED ORDER — FENTANYL CITRATE (PF) 100 MCG/2ML IJ SOLN
100.0000 ug | INTRAMUSCULAR | Status: AC | PRN
  Administered 2017-03-22 (×3): 100 ug via INTRAVENOUS
  Filled 2017-03-22 (×2): qty 2

## 2017-03-22 MED ORDER — POTASSIUM CHLORIDE 10 MEQ/100ML IV SOLN
INTRAVENOUS | Status: AC
  Filled 2017-03-22: qty 100

## 2017-03-22 MED ORDER — VITAL HIGH PROTEIN PO LIQD
1000.0000 mL | ORAL | Status: DC
  Administered 2017-03-22: 1000 mL

## 2017-03-22 MED ORDER — FENTANYL CITRATE (PF) 100 MCG/2ML IJ SOLN
100.0000 ug | INTRAMUSCULAR | Status: DC | PRN
Start: 2017-03-22 — End: 2017-03-22

## 2017-03-22 MED ORDER — ASPIRIN 81 MG PO CHEW: 324.0000 mg | CHEWABLE_TABLET | ORAL | Status: DC

## 2017-03-22 MED ORDER — SODIUM BICARBONATE 8.4 % IV SOLN: 100.0000 meq | Freq: Once | INTRAVENOUS | Status: DC

## 2017-03-22 MED ORDER — MAGNESIUM SULFATE 2 GM/50ML IV SOLN
2.0000 g | Freq: Once | INTRAVENOUS | Status: AC
  Administered 2017-03-22: 2 g via INTRAVENOUS
  Filled 2017-03-22: qty 50

## 2017-03-22 MED ORDER — LORAZEPAM 2 MG/ML IJ SOLN
2.0000 mg | Freq: Once | INTRAMUSCULAR | Status: AC
  Administered 2017-03-22: 2 mg via INTRAVENOUS

## 2017-03-22 MED ORDER — DEXTROSE 50 % IV SOLN
INTRAVENOUS | Status: AC
  Administered 2017-03-22: 25 mL
  Filled 2017-03-22: qty 50

## 2017-03-22 MED ORDER — SODIUM CHLORIDE 0.9 % IV SOLN: 250.0000 mL | INTRAVENOUS | Status: DC | PRN

## 2017-03-22 MED ORDER — POTASSIUM CHLORIDE 10 MEQ/100ML IV SOLN
10.0000 meq | INTRAVENOUS | Status: AC
  Administered 2017-03-22 (×6): 10 meq via INTRAVENOUS
  Filled 2017-03-22 (×5): qty 100

## 2017-03-22 MED ORDER — CHLORHEXIDINE GLUCONATE 0.12% ORAL RINSE (MEDLINE KIT)
15.0000 mL | Freq: Two times a day (BID) | OROMUCOSAL | Status: DC
  Administered 2017-03-22 (×2): 15 mL via OROMUCOSAL

## 2017-03-22 MED ORDER — DEXTROSE 50 % IV SOLN
25.0000 mL | Freq: Once | INTRAVENOUS | Status: AC
  Administered 2017-03-22: 25 mL via INTRAVENOUS

## 2017-03-22 MED ORDER — PRO-STAT SUGAR FREE PO LIQD
30.0000 mL | Freq: Two times a day (BID) | ORAL | Status: DC
  Administered 2017-03-22 – 2017-03-23 (×2): 30 mL
  Filled 2017-03-22 (×2): qty 30

## 2017-03-22 MED ORDER — ALBUTEROL SULFATE (2.5 MG/3ML) 0.083% IN NEBU: 2.5000 mg | INHALATION_SOLUTION | RESPIRATORY_TRACT | Status: DC | PRN

## 2017-03-22 MED ORDER — ORAL CARE MOUTH RINSE
15.0000 mL | Freq: Four times a day (QID) | OROMUCOSAL | Status: DC
  Administered 2017-03-22 (×2): 15 mL via OROMUCOSAL

## 2017-03-22 MED ORDER — ORAL CARE MOUTH RINSE
15.0000 mL | OROMUCOSAL | Status: DC
  Administered 2017-03-22 – 2017-03-23 (×11): 15 mL via OROMUCOSAL

## 2017-03-22 MED ORDER — SODIUM CHLORIDE 0.9 % IV BOLUS (SEPSIS)
1000.0000 mL | Freq: Once | INTRAVENOUS | Status: AC
  Administered 2017-03-22: 1000 mL via INTRAVENOUS

## 2017-03-22 MED ORDER — DEXTROSE 50 % IV SOLN
1.0000 | Freq: Once | INTRAVENOUS | Status: AC
  Administered 2017-03-22: 50 mL via INTRAVENOUS

## 2017-03-22 MED ORDER — PROPOFOL 1000 MG/100ML IV EMUL
0.0000 ug/kg/min | INTRAVENOUS | Status: DC
Start: 2017-03-22 — End: 2017-03-23
  Administered 2017-03-22 (×3): 45 ug/kg/min via INTRAVENOUS
  Administered 2017-03-23: 50 ug/kg/min via INTRAVENOUS
  Filled 2017-03-22 (×3): qty 100

## 2017-03-22 MED ORDER — CHLORHEXIDINE GLUCONATE 0.12% ORAL RINSE (MEDLINE KIT): 15.0000 mL | Freq: Two times a day (BID) | OROMUCOSAL | Status: DC

## 2017-03-22 MED ORDER — SUCCINYLCHOLINE CHLORIDE 20 MG/ML IJ SOLN
INTRAMUSCULAR | Status: AC | PRN
  Administered 2017-03-22: 100 mg via INTRAVENOUS

## 2017-03-22 MED ORDER — DOCUSATE SODIUM 50 MG/5ML PO LIQD: 100.0000 mg | Freq: Two times a day (BID) | ORAL | Status: DC | PRN

## 2017-03-22 MED ORDER — SODIUM BICARBONATE 8.4 % IV SOLN
INTRAVENOUS | Status: DC | PRN
  Administered 2017-03-22 (×2): 50 meq via INTRAVENOUS

## 2017-03-22 MED ORDER — ASPIRIN 300 MG RE SUPP: 300.0000 mg | RECTAL | Status: DC

## 2017-03-22 NOTE — Progress Notes (Signed)
Dr. Merlene PullingHammonds notified of lactic acid results.

## 2017-03-22 NOTE — ED Notes (Signed)
Pt had full body seizure with nursing staff, pt rolled to side, suction and oxygen applied. Dr Manus Gunningancour notified,

## 2017-03-22 NOTE — Progress Notes (Signed)
PULMONARY / CRITICAL CARE MEDICINE   Name: Barrie FolkKaren Lyall MRN: [id number] DOB: 12/07/1995    ADMISSION DATE:  03/22/2017  CHIEF COMPLAINT:  Overdose, Seizure  HISTORY OF PRESENT ILLNESS:   21yoF with hx GERD, presents today as a transfer from [facility] where she presented this morning after being found down in her house. Her parents say they last saw her normal on 10/21 at 1:30am. At approx 5:30am they found her on the floor unresponsive next to an empty bottle of benadryl. EMS was called and reportedly she had a seizure with EMS as well as another seizure at the outside hospital ER. When she first presented to them she had tachycardia in 170's with wide QRS and prolonged QT. She was given Magnesium and Bicarb with improvement of both. She was intubated and transferred to [facility]. At the time of my exam, her family has not yet arrived at the hospital. Per the outside hospital report, the family reports they think she took 70 tabs of benadryl; in addition an ibuprofen bottle was missing.   PAST MEDICAL HISTORY :  She  has a past medical history of GERD (gastroesophageal reflux disease).  PAST SURGICAL HISTORY: She  has no past surgical history on file.  No Known Allergies  No current facility-administered medications on file prior to encounter.    Current Outpatient Prescriptions on File Prior to Encounter  Medication Sig  . Multiple Vitamin (MULTIVITAMIN) tablet Take 1 tablet by mouth daily.  . Omega-3 Fatty Acids (FISH OIL) 1000 MG CAPS Take 1 capsule by mouth daily.   FAMILY HISTORY:  Her indicated that the status of her sister is unknown. She indicated that the status of her brother is unknown.   SOCIAL HISTORY: She  reports that she has never smoked. She has never used smokeless tobacco. She reports that she does not drink alcohol or use drugs.  REVIEW OF SYSTEMS:   Review of Systems  Unable to perform ROS: Critical illness   SUBJECTIVE:  Intubated and sedated    VITAL SIGNS: BP 128/88   Pulse (!) 107   Temp (!) 97.4 F (36.3 C) (Oral)   Resp 19   Ht 5\' 2"  (1.575 m)   Wt 40.8 kg (90 lb)   SpO2 100%   BMI 16.46 kg/m   VENTILATOR SETTINGS: Vent Mode: PRVC FiO2 (%):  [35 %-100 %] 35 % Set Rate:  [16 bmp] 16 bmp Vt Set:  [400 mL] 400 mL PEEP:  [5 cmH20] 5 cmH20 Plateau Pressure:  [13 cmH20] 13 cmH20  INTAKE / OUTPUT: No intake/output data recorded.  PHYSICAL EXAMINATION: General: young thin female, intubated and sedated, critically ill Neuro: sedated, dilated 4mm pupils are equal and reactive to light, coughs to suctioning, not obeying commands. No ETT secretions to suctioning.  HEENT: OP clear, MM moist, Orally intubated Cardiovascular: RRR , no m/r/g, HR 89 Lungs: CTA b/l Abdomen: soft NTND, BS+ Musculoskeletal: no LE edema Skin: no rashes or abrasions  LABS:  BMET  Recent Labs Lab 03/22/17 0620 03/22/17 0638  NA 142 143  K 3.4* 3.2*  CL 102 99*  CO2 24  --   BUN 6 <3*  CREATININE 0.72 0.50  GLUCOSE 123* 125*   Electrolytes  Recent Labs Lab 03/22/17 0620  CALCIUM 8.4*  MG 1.8   CBC  Recent Labs Lab 03/22/17 0620 03/22/17 0638  WBC 9.2  --   HGB 12.7 12.6  HCT 37.1 37.0  PLT 235  --  Coag's No results for input(s): APTT, INR in the last 168 hours.  Sepsis Markers  Recent Labs Lab 03/22/17 0633  LATICACIDVEN 9.29*   ABG  Recent Labs Lab 03/22/17 0610  PHART 7.351  PCO2ART 47.6  PO2ART RBV ABOVE REPORTABLE RANGE C.[place],RN BY B.SMITH,RRT,RCP ON 03/22/17 AT 06:18.   Liver Enzymes  Recent Labs Lab 03/22/17 0620  AST 29  ALT 12*  ALKPHOS 44  BILITOT 0.2*  ALBUMIN 3.9   Cardiac Enzymes  Recent Labs Lab 03/22/17 0620  TROPONINI <0.03   Glucose  Recent Labs Lab 03/22/17 0943  GLUCAP 65   Imaging Ct Head Wo Contrast  Result Date: 03/22/2017 CLINICAL DATA:  Patient unresponsive to painful stimuli. Found unconscious earlier today. EXAM: CT HEAD WITHOUT CONTRAST  TECHNIQUE: Contiguous axial images were obtained from the base of the skull through the vertex without intravenous contrast. COMPARISON:  None. FINDINGS: Brain: No evidence for acute infarction, hemorrhage, mass lesion, hydrocephalus, or extra-axial fluid. Normal cerebral volume. No white matter disease. 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 Vascular: No hyperdense vessel or unexpected calcification. Skull: Normal. Negative for fracture or focal lesion. Bony remodeling in the region of the posterior fossa CSF collection indicates chronicity. Sinuses/Orbits: No acute finding. Other: None. IMPRESSION: Negative exam. No cause is seen for the reported altered level of consciousness. Electronically Signed   By: Elsie Stain M.D.   On: 03/22/2017 07:19   Dg Chest Portable 1 View  Result Date: 03/22/2017 CLINICAL DATA:  21 y/o  F; seizure and altered mental status. EXAM: PORTABLE CHEST 1 VIEW COMPARISON:  None. FINDINGS: Normal cardiac silhouette. Clear lungs. Bones are unremarkable. Endotracheal tube is 2.4 cm carina. Enteric tube tip projects over the mid esophagus in thorax. IMPRESSION: 1. No acute cardiopulmonary process identified. 2. Endotracheal tube 2.4 cm from carina. 3. Enteric tube tip in mid esophagus, advancement recommended. These results were called by telephone at the time of interpretation on 03/22/2017 at 6:32 am to Dr. Glynn Octave , who verbally acknowledged these results. Electronically Signed   By: Mitzi Hansen M.D.   On: 03/22/2017 06:33   STUDIES:  CXR (10/21): ETT in good position; no infiltrates or other active disease  Head CT (10/21): IMPRESSION: Negative exam. No cause is seen for the reported altered level of consciousness.  SIGNIFICANT EVENTS: 10/21: found down, intubated at outside hospital, transferred to [facility]  LINES/TUBES: 2-20G PIV's   ASSESSMENT / PLAN: 21yoF with hx GERD, presents today as a  transfer from [facility] where she presented this morning after being found down in her house. Her parents say they last saw her normal on 10/21 at 1:30am. At approx 5:30am they found her on the floor unresponsive next to an empty bottle of benadryl. EMS was called and reportedly she had a seizure with EMS as well as another seizure at the outside hospital ER. When she first presented to them she had tachycardia in 170's with wide QRS and prolonged QT. She was given Magnesium and Bicarb with improvement of both. She was intubated and transferred to [facility]. At the time of my exam, her family has not yet arrived at the hospital. Per the outside hospital report, the family reports they think she took 70 tabs of benadryl; in addition an ibuprofen bottle was missing.   PULMONARY 1. Acute Respiratory failure: intubated for airway protection - CXR shows no acute disease; ETT in good position - GI and DVT prophylaxis - start Tube feeds -  no SBT's until underlying reason for respiratory failure is corrected - CXR and ABG in AM  CARDIOVASCULAR 1. SVT with Wide QRS and Prolonged QTc: due to the drug overdose  - EKG at the outside hospital showed SVT @ 160 with QTc 600 and QRS 133 - EKG on arrival to [facility] showed NSR @ 90 with QTc 501 and QRS 74 - Continue monitoring EKG q4hrs per poison control recs - If QRS >120 will give Bicarb  RENAL 1. Hypokalemia; Hypomagnesemia: - K 3.2 this AM; repleted with KCL IV - Mg 1.8 this AM; repleted with 2g MagSulfate IV at outside hospital prior to arrival - continue to monitor; BMP in AM  GASTROINTESTINAL 1. Nutrition; Hypoglycemia: - start tube feeds - patient hypoglycemic this AM before tube feeds were started; will change her NS gtt to D5NS gtt  HEMATOLOGIC No active issues   INFECTIOUS No active issues   ENDOCRINE 1. Hypoglycemia: - see plan above   NEUROLOGIC 1. Acute Encephalopathy; Drug overdose (diphenhydramine and questionable  ibuprofen); Seizures: - continue sedation on propofol - consulted Neurology - ordered EEG - Head CT: normal.  - continue Keppra.  - contacted poison control; they recommended EKG q4hrs and treat with Bicarb if QRS >120. Unfortunately she is out of the window for treatment with activated charcoal - lactic acidosis likely from her seizures; no signs of sepsis. Will continue to trend it. Will not start empiric antibiotics.  - negative APAP, Salicylates, Etoh, and UDS.  60 minutes critical care time  Milana Obey, MD  Pulmonary and Critical Care Medicine [facility] Pager: [phone]  03/22/2017, 10:02 AM

## 2017-03-22 NOTE — Progress Notes (Signed)
Poison control notified of the patients case by myself.

## 2017-03-22 NOTE — ED Provider Notes (Signed)
[facility] EMERGENCY DEPARTMENT Provider Note   CSN: [id number] Arrival date & time: 03/22/17  0539     History   Chief Complaint Chief Complaint  Patient presents with  . Seizures    HPI Stacie Douglas is a 21 y.o. female.  Level 5 caveat for acuity if condition.  Patient brought in by EMS after being found unresponsive on the floor.  Family had seen her last normal about 3 hours prior to this.  Family reports she had a seizure that lasted about 15 seconds and another seizure witnessed by EMS.  She was given 2 mg of Ativan by EMS.  On arrival patient is obtunded, does not respond to pain, heart rate 170s.  EMS reports a bottle of Benadryl was found near the patient that was not full.  She may have taken this. Patient's father has arrived and states the only other medications in the house or ibuprofen.  He does not think the Benadryl bottle was full.  Patient has no history of seizures.  Patient was admitted for psychosis in December last year but was not prescribed any medications.  She has made comments about depression and suicide in the past.   The history is provided by the patient and the EMS personnel. The history is limited by the condition of the patient.  Seizures      Past Medical History:  Diagnosis Date  . GERD (gastroesophageal reflux disease)     There are no active problems to display for this patient.   No past surgical history on file.  OB History    Gravida Para Term Preterm AB Living             0   SAB TAB Ectopic Multiple Live Births                   Home Medications    Prior to Admission medications   Medication Sig Start Date End Date Taking? Authorizing Provider  Multiple Vitamin (MULTIVITAMIN) tablet Take 1 tablet by mouth daily.    [provider]  Omega-3 Fatty Acids (FISH OIL) 1000 MG CAPS Take 1 capsule by mouth daily.    [provider]    Family History Family History  Problem Relation Age of Onset  . Autism  Sister   . Autism Brother     Social History Social History  Substance Use Topics  . Smoking status: Never Smoker  . Smokeless tobacco: Never Used  . Alcohol use No     Allergies   Patient has no known allergies.   Review of Systems Review of Systems  Unable to perform ROS: Acuity of condition  Neurological: Positive for seizures.     Physical Exam Updated Vital Signs BP 111/75   Pulse (!) 130   Temp (!) 95.9 F (35.5 C) (Rectal)   Resp 16   Ht 5' 2"  (1.575 m)   Wt 40.8 kg (90 lb)   SpO2 100%   BMI 16.46 kg/m   Physical Exam  Constitutional:  Obtunded.  Nonverbal.   HENT:  Head: Normocephalic and atraumatic.  Right Ear: External ear normal.  Left Ear: External ear normal.  Eyes: Pupils are equal, round, and reactive to light. Conjunctivae and EOM are normal.  Dysconjugate gaze.  Fluttering nystagmus.  Neck: Normal range of motion. Neck supple.  Cardiovascular: Regular rhythm and normal heart sounds.   No murmur heard. Tachycardia 160s  Pulmonary/Chest: Effort normal. No respiratory distress.  Abdominal: Soft. There is  no tenderness.  Musculoskeletal: Normal range of motion. She exhibits no edema, tenderness or deformity.  Neurological:  Obtunded. Nonverbal. Does not follow commands. Does not protect face from falling arm.  Skin: Skin is warm. Capillary refill takes less than 2 seconds.     ED Treatments / Results  Labs (all labs ordered are listed, but only abnormal results are displayed) Labs Reviewed  I-STAT CG4 LACTIC ACID, ED - Abnormal; Notable for the following:       Result Value   Lactic Acid, Venous 9.29 (*)    All other components within normal limits  I-STAT CHEM 8, ED - Abnormal; Notable for the following:    Potassium 3.2 (*)    Chloride 99 (*)    BUN <3 (*)    Glucose, Bld 125 (*)    Calcium, Ion 0.95 (*)    All other components within normal limits  BLOOD GAS, ARTERIAL  CBC WITH DIFFERENTIAL/PLATELET  COMPREHENSIVE  METABOLIC PANEL  ETHANOL  SALICYLATE LEVEL  ACETAMINOPHEN LEVEL  RAPID URINE DRUG SCREEN, HOSP PERFORMED  URINALYSIS, ROUTINE W REFLEX MICROSCOPIC  MAGNESIUM  TROPONIN I  CK  I-STAT BETA HCG BLOOD, ED ([facility], [facility], [facility] ONLY)    EKG  EKG Interpretation  Date/Time:  Sunday March 22 2017 05:46:32 EDT Ventricular Rate:  157 PR Interval:    QRS Duration: 133 QT Interval:  371 QTC Calculation: 600 R Axis:   109 Text Interpretation:  Sinus tachycardia RBBB and LPFB Prolonged QT No previous ECGs available Confirmed by Ezequiel Essex [phone]) on 03/22/2017 6:15:48 AM       Radiology Dg Chest Portable 1 View  Result Date: 03/22/2017 CLINICAL DATA:  21 y/o  F; seizure and altered mental status. EXAM: PORTABLE CHEST 1 VIEW COMPARISON:  None. FINDINGS: Normal cardiac silhouette. Clear lungs. Bones are unremarkable. Endotracheal tube is 2.4 cm carina. Enteric tube tip projects over the mid esophagus in thorax. IMPRESSION: 1. No acute cardiopulmonary process identified. 2. Endotracheal tube 2.4 cm from carina. 3. Enteric tube tip in mid esophagus, advancement recommended. These results were called by telephone at the time of interpretation on 03/22/2017 at 6:32 am to Dr. Ezequiel Essex , who verbally acknowledged these results. Electronically Signed   By: Kristine Garbe M.D.   On: 03/22/2017 06:33    Procedures Procedure Name: Intubation Date/Time: 03/22/2017 6:45 AM Performed by: Ezequiel Essex Pre-anesthesia Checklist: Patient identified, Emergency Drugs available, Suction available, Timeout performed and Patient being monitored Oxygen Delivery Method: Ambu bag Preoxygenation: Pre-oxygenation with 100% oxygen Induction Type: Rapid sequence Ventilation: Mask ventilation without difficulty Laryngoscope Size: Mac, Glidescope and 3 Grade View: Grade I Tube type: Subglottic suction tube Tube size: 7.0 mm Number of attempts: 1 Airway Equipment and Method: Bougie stylet,   Video-laryngoscopy and Rigid stylet Placement Confirmation: ETT inserted through vocal cords under direct vision,  Positive ETCO2 and Breath sounds checked- equal and bilateral Secured at: 23 cm Tube secured with: ETT holder Dental Injury: Teeth and Oropharynx as per pre-operative assessment  Future Recommendations: Recommend- induction with short-acting agent, and alternative techniques readily available Comments: 7.5 tube was too large and could not passed through vocal cords.  This was exchanged over a bougie for 7.0 tube      (including critical care time)  Medications Ordered in ED Medications  sodium chloride 0.9 % bolus 1,000 mL (1,000 mLs Intravenous New Bag/Given 03/22/17 0624)  sodium bicarbonate injection 100 mEq (100 mEq Intravenous Not Given 03/22/17 0615)  sodium chloride 0.9 % bolus 1,000  mL (1,000 mLs Intravenous New Bag/Given 03/22/17 0628)  fentaNYL (SUBLIMAZE) injection 100 mcg (100 mcg Intravenous Given 03/22/17 0637)  fentaNYL (SUBLIMAZE) injection 100 mcg (not administered)  magnesium sulfate IVPB 2 g 50 mL (2 g Intravenous New Bag/Given 03/22/17 0629)  etomidate (AMIDATE) injection (20 mg Intravenous Given 03/22/17 0601)  succinylcholine (ANECTINE) injection (100 mg Intravenous Given 03/22/17 0602)  LORazepam (ATIVAN) injection 2 mg (2 mg Intravenous Given 03/22/17 0556)  propofol (DIPRIVAN) 1000 MG/100ML infusion (25 mcg/kg/min  40.8 kg Intravenous Rate/Dose Change 03/22/17 0628)  levETIRAcetam (KEPPRA) IVPB 1000 mg/100 mL premix (0 mg Intravenous Stopped 03/22/17 0639)     Initial Impression / Assessment and Plan / ED Course  I have reviewed the triage vital signs and the nursing notes.  Pertinent labs & imaging results that were available during my care of the patient were reviewed by me and considered in my medical decision making (see chart for details).    Patient presents from home after witnessed seizure activity.  No history of same.  Concern for  possible overdose with Benadryl and possible other substances.  Patient had seizure upon arrival to the ED with minimal gag and was not protecting her airway.  Patient was intubated as above.  Initial EKG was sinus tachycardia, wide QRS, prolonged QT.  QRS and QT have improved after receiving bicarb and magnesium.  Father has arrived and states there are no other medications in the hospital other than Benadryl and ibuprofen.  CT head is stable.  Patient given IV fluids, loaded with IV Keppra and sedated with propofol.  QRS has normalized. PH normal so further bicarb held  Tox screen is negative.  Lactate is 9 with anion gap of 16.  Concern for possible tricyclic overdose given patient seizure activity and prolonged QRS on initial EKG. anticholinergic overdose does not generally cause widened QRS or seizure activity  D/w critical care Dr. Jimmey Ralph who accepts patient to [facility] ICU.  She appears stable for transfer.  CRITICAL CARE Performed by: Ezequiel Essex Total critical care time: 60 minutes Critical care time was exclusive of separately billable procedures and treating other patients. Critical care was necessary to treat or prevent imminent or life-threatening deterioration. Critical care was time spent personally by me on the following activities: development of treatment plan with patient and/or surrogate as well as nursing, discussions with consultants, evaluation of patient's response to treatment, examination of patient, obtaining history from patient or surrogate, ordering and performing treatments and interventions, ordering and review of laboratory studies, ordering and review of radiographic studies, pulse oximetry and re-evaluation of patient's condition.  Final Clinical Impressions(s) / ED Diagnoses   Final diagnoses:  Acute respiratory failure with hypoxia ([place])  Seizure ([place])  Drug overdose, undetermined intent, initial encounter    New Prescriptions New Prescriptions     No medications on file     Ezequiel Essex, MD 03/22/17 [phone]

## 2017-03-22 NOTE — Progress Notes (Signed)
Hypoglycemic Event  CBG: 41  Treatment: D50 IV 50 mL  Symptoms: None  Follow-up CBG: Time:1652  CBG Result:261                                      1715                        104  Possible Reasons for Event: Inadequate meal intake  Comments/MD notified:awaiting for TF pump to get here so TF can be started. Pt already on d5ns at 100/hr.  Will continue to monitor closely and update as needed. Will recheck again around 1800.  Kerin PernaNicole T. Reggie Welge, RN    Rikki Spearinghomas, Earlee Herald Renee

## 2017-03-22 NOTE — Progress Notes (Signed)
Hypoglycemic Event  CBG: 63  Treatment: D50 IV 50 mL  Symptoms: None  Follow-up CBG: Time: 0006 CBG Result: 132  Possible Reasons for Event: Unknown  Comments/MD notified: MD not notified. Protocol effective.     Adelene IdlerKaziah M Tobin Cadiente

## 2017-03-22 NOTE — ED Triage Notes (Addendum)
Ems was called out to pt residence because pt was found slumped in floor, not responding by mom, ems reports that upon their arrival to pt she was flaccid and would not respond, pt has "full body" seizure that last 15 seconds with ems after pt was moved to ems unit, she was given 2 mg ativan iv by ems, upon arrival to er pt unresponsive to painful stimuli. Hr 162, Dr Manus Gunningancour notified and at bedside, ems states that mom reported to them that she had found a whole bottle of benadryl 25mg  100 tablets qty. Empty

## 2017-03-22 NOTE — Progress Notes (Signed)
Hypoglycemic Event  CBG: 41  Treatment: D50 IV 50 mL  Symptoms: None  Follow-up CBG: Time: 2017 CBG Result: 111  Possible Reasons for Event: Inadequate meal intake  Comments/MD notified: MD not notified. Protocol was effective.     Stacie IdlerKaziah M Isack Lavalley

## 2017-03-22 NOTE — Progress Notes (Signed)
Brief Nutrition Note  Consult received for enteral/tube feeding initiation and management.  Adult Enteral Nutrition Protocol initiated. Full assessment to follow.  Admitting Dx: Seizure (HCC) [R56.9] Acute respiratory failure with hypoxia (HCC) [J96.01] Drug overdose, undetermined intent, initial encounter [T50.904A]  Body mass index is 16.46 kg/m. Pt meets criteria for underweight based on current BMI.  Labs:   Recent Labs Lab 03/22/17 0620 03/22/17 0638 03/22/17 1054  NA 142 143 139  K 3.4* 3.2* 3.4*  CL 102 99* 107  CO2 24  --  24  BUN 6 <3* <5*  CREATININE 0.72 0.50 0.77  CALCIUM 8.4*  --  7.8*  MG 1.8  --  2.3  PHOS  --   --  1.8*  GLUCOSE 123* 125* 104*    Stacie FrancoLindsey Leaman [name], MS, RD, LDN [facility] Clinical Dietitian Pager: [phone] After Hours Pager: [phone]

## 2017-03-23 ENCOUNTER — Other Ambulatory Visit (HOSPITAL_COMMUNITY): Payer: Medicaid Other

## 2017-03-23 ENCOUNTER — Inpatient Hospital Stay (HOSPITAL_COMMUNITY): Payer: Medicaid Other

## 2017-03-23 DIAGNOSIS — T450X2A Poisoning by antiallergic and antiemetic drugs, intentional self-harm, initial encounter: Principal | ICD-10-CM

## 2017-03-23 DIAGNOSIS — T1491XA Suicide attempt, initial encounter: Secondary | ICD-10-CM

## 2017-03-23 DIAGNOSIS — T450X2D Poisoning by antiallergic and antiemetic drugs, intentional self-harm, subsequent encounter: Secondary | ICD-10-CM

## 2017-03-23 DIAGNOSIS — E872 Acidosis: Secondary | ICD-10-CM

## 2017-03-23 LAB — POCT I-STAT 3, ART BLOOD GAS (G3+)
Acid-base deficit: 4 mmol/L — ABNORMAL HIGH (ref 0.0–2.0)
BICARBONATE: 21.4 mmol/L (ref 20.0–28.0)
O2 Saturation: 99 %
PCO2 ART: 40 mmHg (ref 32.0–48.0)
PH ART: 7.336 — AB (ref 7.350–7.450)
PO2 ART: 144 mmHg — AB (ref 83.0–108.0)
Patient temperature: 98.6
TCO2: 23 mmol/L (ref 22–32)

## 2017-03-23 LAB — GLUCOSE, CAPILLARY
GLUCOSE-CAPILLARY: 47 mg/dL — AB (ref 65–99)
GLUCOSE-CAPILLARY: 110 mg/dL — AB (ref 65–99)
GLUCOSE-CAPILLARY: 87 mg/dL (ref 65–99)
GLUCOSE-CAPILLARY: 84 mg/dL (ref 65–99)
GLUCOSE-CAPILLARY: 82 mg/dL (ref 65–99)
Glucose-Capillary: 132 mg/dL — ABNORMAL HIGH (ref 65–99)
Glucose-Capillary: 70 mg/dL (ref 65–99)

## 2017-03-23 LAB — BASIC METABOLIC PANEL
Anion gap: 8 (ref 5–15)
BUN: 5 mg/dL — AB (ref 6–20)
CALCIUM: 7.9 mg/dL — AB (ref 8.9–10.3)
CO2: 20 mmol/L — ABNORMAL LOW (ref 22–32)
CREATININE: 0.62 mg/dL (ref 0.44–1.00)
Chloride: 109 mmol/L (ref 101–111)
GFR calc Af Amer: >60 mL/min (ref >60)
GLUCOSE: 101 mg/dL — AB (ref 65–99)
Potassium: 4.3 mmol/L (ref 3.5–5.1)
Sodium: 137 mmol/L (ref 135–145)

## 2017-03-23 LAB — CBC
HEMATOCRIT: 33 % — AB (ref 36.0–46.0)
Hemoglobin: 10.9 g/dL — ABNORMAL LOW (ref 12.0–15.0)
MCH: 30.3 pg (ref 26.0–34.0)
MCHC: 33 g/dL (ref 30.0–36.0)
MCV: 91.7 fL (ref 78.0–100.0)
PLATELETS: 203 10*3/uL (ref 150–400)
RBC: 3.6 MIL/uL — AB (ref 3.87–5.11)
RDW: 13.3 % (ref 11.5–15.5)
WBC: 11.3 10*3/uL — ABNORMAL HIGH (ref 4.0–10.5)

## 2017-03-23 LAB — MAGNESIUM
Magnesium: 1.8 mg/dL (ref 1.7–2.4)
Magnesium: 1.7 mg/dL (ref 1.7–2.4)

## 2017-03-23 LAB — PHOSPHORUS
Phosphorus: 3.4 mg/dL (ref 2.5–4.6)
Phosphorus: 2.9 mg/dL (ref 2.5–4.6)

## 2017-03-23 LAB — HIV ANTIBODY (ROUTINE TESTING W REFLEX): HIV SCREEN 4TH GENERATION: NONREACTIVE

## 2017-03-23 MED ORDER — DEXTROSE IN LACTATED RINGERS 5 % IV SOLN
INTRAVENOUS | Status: DC
  Administered 2017-03-23 – 2017-03-24 (×2): via INTRAVENOUS

## 2017-03-23 MED ORDER — ORAL CARE MOUTH RINSE: 15.0000 mL | Freq: Two times a day (BID) | OROMUCOSAL | Status: DC

## 2017-03-23 NOTE — Progress Notes (Signed)
Nutrition Brief Note  Consult received for TF management in pt on the vent after overdose.  Pt now extubated.  Diet to be advanced as tolerated.  Labs and medications reviewed.  No nutrition interventions warranted at this time. If nutrition issues arise, please consult RD.   Kendell BaneHeather Vesta Wheeland RD, LDN, CNSC [phone] Pager [phone] After Hours Pager

## 2017-03-23 NOTE — Progress Notes (Signed)
MD at bedside assessing pt and discussing POC. MD informed of pt heart rate 110's and temp 100.9. No new orders received

## 2017-03-23 NOTE — Progress Notes (Signed)
Poison control updated on pt's condition. They recommended to continue monitoring patient for any complications but at this point EKG every 4hr can be stopped. They stated that case is closed at this point; however, to call back for any benadryl poison complications.

## 2017-03-23 NOTE — Progress Notes (Signed)
Name: Stacie Douglas MRN:   [id number] DOB:   1995-08-09             ADMISSION DATE:  03/22/2017  CHIEF COMPLAINT:  Overdose, Seizure  HISTORY OF PRESENT ILLNESS:   21yoF with hx GERD, presents today as a transfer from [facility] where she presented on 10/21 during the morning hours after being found down in her house. Her parents say they last saw her normal on 10/21 at 1:30am. At approx 5:30am they found her on the floor unresponsive next to an empty bottle of benadryl. EMS was called and reportedly she had a seizure with EMS as well as another seizure at the outside hospital ER. When she first presented to them she had tachycardia in 170's with wide QRS and prolonged QT. She was given Magnesium and Bicarb with improvement of both. She was intubated and transferred to [facility] Per the outside hospital report, the family reports they think she took 70 tabs of benadryl; in addition an ibuprofen bottle was missing.   Events: 10/21 transferred to [facility] 10/22  Subjective: Awake, emotional, but cooperative ventilator mechanics look excellent Objective: BP 113/82 (BP Location: Right Arm)   Pulse (!) 118   Temp (!) 100.9 F (38.3 C) (Oral)   Resp 10   Ht 5\' 2"  (1.575 m)   Wt 101 lb 13.6 oz (46.2 kg)   SpO2 100%   BMI 18.63 kg/m      Intake/Output Summary (Last 24 hours) at 03/23/17 0909 Last data filed at 03/23/17 0800  Gross per 24 hour  Intake          3800.95 ml  Output             2175 ml  Net          1625.95 ml   Physical exam Gen.: 21 year old African-American female currently on pressure support ventilation with excellent lung mechanics no distress HEENT: Normocephalic atraumatic mucous membranes are moist orally intubated Pulmonary clear to auscultation no accessory muscle use frequency tidal volume ratio excellent Cardiac: Tachycardia regular rate and rhythm without murmur rub or gallop Abdomen soft nontender no organomegaly GU clear yellow  urine Extremities warm dry brisk cap refill good pulses Neuro: Awake oriented and moves all extremities no focal deficits follows commands  CBC Recent Labs     03/22/17  0620  03/22/17  0638  03/22/17  1054  03/23/17  0543  WBC  9.2   --   12.4*  11.3*  HGB  12.7  12.6  12.8  10.9*  HCT  37.1  37.0  36.8  33.0*  PLT  235   --   230  203    Coag's Recent Labs     03/22/17  1054  INR  1.13    BMET Recent Labs     03/22/17  0620  03/22/17  0638  03/22/17  1054  03/23/17  0543  NA  142  143  139  137  K  3.4*  3.2*  3.4*  4.3  CL  102  99*  107  109  CO2  24   --   24  20*  BUN  6  <3*  <5*  5*  CREATININE  0.72  0.50  0.77  0.62  GLUCOSE  123*  125*  104*  101*    Electrolytes Recent Labs     03/22/17  0620   03/22/17  1054  03/22/17  1427  03/22/17  1641  03/23/17  0543  CALCIUM  8.4*   --   7.8*   --    --   7.9*  MG  1.8   --   2.3  2.2  2.1  1.8  PHOS   --    < >  1.8*  2.4*  2.2*  3.4   < > = values in this interval not displayed.    Sepsis Markers No results for input(s): PROCALCITON, O2SATVEN in the last 72 hours.  Invalid input(s): LACTICACIDVEN  ABG Recent Labs     03/22/17  0610  03/22/17  1053  03/23/17  0335  PHART  7.351  7.430  7.336*  PCO2ART  47.6  34.8  40.0  PO2ART  RBV ABOVE REPORTABLE RANGE C.[place],RN BY B.SMITH,RRT,RCP ON 03/22/17 AT 06:18.  185*  144.0*    Liver Enzymes Recent Labs     03/22/17  0620  03/22/17  1054  AST  29  26  ALT  12*  14  ALKPHOS  44  44  BILITOT  0.2*  0.7  ALBUMIN  3.9  4.2    Cardiac Enzymes Recent Labs     03/22/17  1054  03/22/17  1427  03/22/17  2152  TROPONINI  <0.03  0.03*  <0.03    Glucose Recent Labs     03/22/17  1952  03/22/17  2017  03/22/17  2340  03/23/17  0006  03/23/17  0338  03/23/17  0851  GLUCAP  48*  111*  63*  132*  110*  70    Imaging Ct Head Wo Contrast  Result Date: 03/22/2017 CLINICAL DATA:  Patient unresponsive to painful stimuli. Found  unconscious earlier today. EXAM: CT HEAD WITHOUT CONTRAST TECHNIQUE: Contiguous axial images were obtained from the base of the skull through the vertex without intravenous contrast. COMPARISON:  None. FINDINGS: Brain: No evidence for acute infarction, hemorrhage, mass lesion, hydrocephalus, or extra-axial fluid. Normal cerebral volume. No white matter disease. 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 Vascular: No hyperdense vessel or unexpected calcification. Skull: Normal. Negative for fracture or focal lesion. Bony remodeling in the region of the posterior fossa CSF collection indicates chronicity. Sinuses/Orbits: No acute finding. Other: None. IMPRESSION: Negative exam. No cause is seen for the reported altered level of consciousness. Electronically Signed   By: Elsie StainJohn T Curnes M.D.   On: 03/22/2017 07:19   Dg Chest Port 1 View  Result Date: 03/23/2017 CLINICAL DATA:  Respiratory failure EXAM: PORTABLE CHEST 1 VIEW COMPARISON:  03/22/2017 FINDINGS: Cardiac shadow is within normal limits. Endotracheal tube and nasogastric catheter are noted in satisfactory position stable from the prior exam. The lungs are well aerated bilaterally. No focal infiltrate or sizable effusion is noted. IMPRESSION: No active disease. Electronically Signed   By: Alcide CleverMark  Lukens M.D.   On: 03/23/2017 07:37   Dg Chest Port 1 View  Result Date: 03/22/2017 CLINICAL DATA:  Pt brought to ed unresponsive, per ems had 15 sec seizure, per mother she found a whole bottle of benadryl empty. EXAM: PORTABLE CHEST 1 VIEW COMPARISON:  None. FINDINGS: Endotracheal tube with the tip 2.2 cm above the carina. Nasogastric tube coursing below the diaphragm. No focal consolidation. No pleural effusion or pneumothorax. Stable cardiomediastinal silhouette. No acute osseous abnormality. IMPRESSION: 1. Endotracheal tube with the tip 2.2 cm above the carina. 2. No active cardiopulmonary disease. Electronically  Signed   By: Elige KoHetal  Patel   On: 03/22/2017 10:32  Dg Chest Portable 1 View  Result Date: 03/22/2017 CLINICAL DATA:  21 y/o  F; seizure and altered mental status. EXAM: PORTABLE CHEST 1 VIEW COMPARISON:  None. FINDINGS: Normal cardiac silhouette. Clear lungs. Bones are unremarkable. Endotracheal tube is 2.4 cm carina. Enteric tube tip projects over the mid esophagus in thorax. IMPRESSION: 1. No acute cardiopulmonary process identified. 2. Endotracheal tube 2.4 cm from carina. 3. Enteric tube tip in mid esophagus, advancement recommended. These results were called by telephone at the time of interpretation on 03/22/2017 at 6:32 am to Dr. Glynn Octave , who verbally acknowledged these results. Electronically Signed   By: Mitzi Hansen M.D.   On: 03/22/2017 06:33   Impression plan  Intentional drug overdose, Benadryl and presumed ibuprofen Prolonged QTC, supraventricular tachycardia as well as sinus tachycardia -Initial QTC was 600, now down to 468, initially treated with magnesium and bicarbonate -Aspirin salicylate alcohol and UDS were negative -CT head negative -Poison control notified -Creatinine has remained stable Plan Continue IV hydration Continue telemetry monitoring Avoid hypokalemia and hypomagnesemia We'll continue every 4 hour EKG monitoring per poison control recommendations, likely discontinue this soon Follow-up a.m. Chemistry Will need psychiatry evaluation following extubation  Acute encephalopathy in the setting of drug overdose Improved Plan Continue supportive care  Reported seizure -Neurology consultation, no further episodes noted Plan Follow-up EEG  Acute respiratory failure in the setting of altered mental status following drug overdose -Passed spontaneous breathing trial -Portable chest x-ray reviewed endotracheal tube in satisfactory position no focal airspace disease Plan Extubate to nasal cannula Wean oxygen Continue pulse  oximetry  Evolving metabolic acidosis, non-anion gap, suspect due to progressive hyperchloremia with saline resuscitation Plan Change IV fluids to D5 lactated Ringer's Continue strict intake output Follow-up a.m. chemistry  Hypoglycemia Plan Continue dextrose IV fluids Diet as tolerated  Mild anemia, suspect hemodilution no evidence of bleeding Plan Follow up CBC Continue heparin Menses per protocol  Low-grade fever and leukocytosis Plan Continue to trend fever and WBC curve Repeat chest x-ray in a.m.  Discussion: 21 year old female with intentional drug overdose with Benadryl and possible ibuprofen. Her EKG is now stable, she is awake, oriented, blood chemistries stable, does have a slight evolving non-anion gap metabolic acidosis however I suspect this is due to rising chloride from sodium chloride resuscitation efforts. She has passed her spontaneous breathing trial, and is now stable for extubation. We'll extubated now, continue telemetry monitoring, continue IV hydration, have a safety sitter at bedside, and consult psychiatry  My critical care time 35 minutes Simonne Martinet ACNP-BC [facility] Pulmonary/Critical Care Pager # [phone] OR # [phone] if no answer  Attending Note:  21 year old female with intentional overdose of benadryl and ?ibuprofen.  Patient is weaning this AM on exam and tolerating it well.  I reviewed CXR myself, ETT is good position.  Will proceed with extubation.  No need for SLP after.  Acidosis noted, will change IVF since it is hyperchloremia driven.  Psych consult called.  PCCM will continue to follow.  The patient is critically ill with multiple organ systems failure and requires high complexity decision making for assessment and support, frequent evaluation and titration of therapies, application of advanced monitoring technologies and extensive interpretation of multiple databases.   Critical Care Time devoted to patient care services described in  this note is  35  Minutes. This time reflects time of care of this signee Dr Koren Bound. This critical care time does not reflect procedure time, or teaching time or  supervisory time of PA/NP/Med student/Med Resident etc but could involve care discussion time.  Rush Farmer, M.D. [facility] Pulmonary/Critical Care Medicine. Pager: [phone]. After hours pager: [phone].

## 2017-03-23 NOTE — Consult Note (Signed)
[facility] Consult   Reason for Consult:  Intentional overdose Referring Physician:  Dr. Jimmey Ralph Patient Identification: Stacie Nored MRN:  [id number] Principal Diagnosis: <principal problem not specified> Diagnosis:   Patient Active Problem List   Diagnosis Date Noted  . Acute respiratory failure ([place]) [J96.00] 03/22/2017  . Diphenhydramine overdose [T45.0X1A] 03/22/2017    Total Time spent with patient: 1 hour  Subjective:   Stacie Douglas is a 21 y.o. female patient admitted with intentional overdose.  HPI: Stacie Douglas is a 21 years old female admitted to the hospital for intentional drug overdose as a suicide attempt and being found unresponsive in the floor reportedly patient has taken intentional overdose of sleeping pills (benadryl) unknown amount with intention to end her life. Patient reported she has been depressed on and off for the last one year and also extremely stressed about her home environment. Patient reported she has been home schooled secondary to multiple the locations in the past and reportedly she has it to younger siblings who has autism. Patient reported she has been smoking cigars once a week and also drinks alcohol more like a wind course. Patient stated her grades are okay. Patient has an disagreement with her boyfriend Rickey who is telling her not to go back with her other friends which is a disagreement between them. Patient urine drug screen is negative for drug of abuse and blood alcohol is not significant. Staff RN reported patient was intubated on arrival and she was extubated today. Patient stated she just woke up she could not talk to any family members. Patient has no family members in the hospital at the time of the evaluation. Based on my evaluation patient meets criteria for acute psychiatric hospitalization when medically stable.   Past Psychiatric History: Patient has no history of seizures.  Patient was admitted for psychosis in December  last year but was not prescribed any medications.  She has made comments about depression and suicide in the past. Patient reported she has a history of acute psychiatric hospitalization at [facility] about a year ago.   Risk to Self:   Risk to Others:   Prior Inpatient Therapy:   Prior Outpatient Therapy:    Past Medical History:  Past Medical History:  Diagnosis Date  . GERD (gastroesophageal reflux disease)    No past surgical history on file. Family History:  Family History  Problem Relation Age of Onset  . Autism Sister   . Autism Brother    Family Psychiatric  History: Patient reported her family history of mental illness is significant for bipolar disorder in her platelets father and OCD in her mother. Social History:  History  Alcohol Use No     History  Drug Use No    Social History   Social History  . Marital status: Single    Spouse name: N/A  . Number of children: N/A  . Years of education: N/A   Social History Main Topics  . Smoking status: Never Smoker  . Smokeless tobacco: Never Used  . Alcohol use No  . Drug use: No  . Sexual activity: No   Other Topics Concern  . None   Social History Narrative  . None   Additional Social History:    Allergies:  No Known Allergies  Labs:  Results for orders placed or performed during the hospital encounter of 03/22/17 (from the past 48 hour(s))  Blood gas, arterial ([facility] & [facility] ONLY)     Status: None  Collection Time: 03/22/17  6:10 AM  Result Value Ref Range   FIO2 100.00    Delivery systems VENTILATOR    Mode PRESSURE REGULATED VOLUME CONTROL    VT 400 mL   LHR 16 resp/min   Peep/cpap 5.0 cm H20   pH, Arterial 7.351 7.350 - 7.450   pCO2 arterial 47.6 32.0 - 48.0 mmHg   pO2, Arterial  83.0 - 108.0 mmHg    RBV ABOVE REPORTABLE RANGE C.WINNINGHAM,RN BY B.SMITH,RRT,RCP ON 03/22/17 AT 06:18.   Bicarbonate 24.6 20.0 - 28.0 mmol/L   Acid-Base Excess 0.7 0.0 - 2.0 mmol/L   O2 Saturation 99.7 %    Patient temperature 37.0    Collection site RIGHT RADIAL    Drawn by [id number]    Sample type ARTERIAL DRAW    Allens test (pass/fail) PASS PASS  CBC with Differential/Platelet     Status: None   Collection Time: 03/22/17  6:20 AM  Result Value Ref Range   WBC 9.2 4.0 - 10.5 K/uL   RBC 4.21 3.87 - 5.11 MIL/uL   Hemoglobin 12.7 12.0 - 15.0 g/dL   HCT 37.1 36.0 - 46.0 %   MCV 88.1 78.0 - 100.0 fL   MCH 30.2 26.0 - 34.0 pg   MCHC 34.2 30.0 - 36.0 g/dL   RDW 12.6 11.5 - 15.5 %   Platelets 235 150 - 400 K/uL   Neutrophils Relative % 78 %   Neutro Abs 7.2 1.7 - 7.7 K/uL   Lymphocytes Relative 18 %   Lymphs Abs 1.7 0.7 - 4.0 K/uL   Monocytes Relative 3 %   Monocytes Absolute 0.3 0.1 - 1.0 K/uL   Eosinophils Relative 1 %   Eosinophils Absolute 0.1 0.0 - 0.7 K/uL   Basophils Relative 0 %   Basophils Absolute 0.0 0.0 - 0.1 K/uL  Comprehensive metabolic panel     Status: Abnormal   Collection Time: 03/22/17  6:20 AM  Result Value Ref Range   Sodium 142 135 - 145 mmol/L   Potassium 3.4 (L) 3.5 - 5.1 mmol/L   Chloride 102 101 - 111 mmol/L   CO2 24 22 - 32 mmol/L   Glucose, Bld 123 (H) 65 - 99 mg/dL   BUN 6 6 - 20 mg/dL   Creatinine, Ser 0.72 0.44 - 1.00 mg/dL   Calcium 8.4 (L) 8.9 - 10.3 mg/dL   Total Protein 6.7 6.5 - 8.1 g/dL   Albumin 3.9 3.5 - 5.0 g/dL   AST 29 15 - 41 U/L   ALT 12 (L) 14 - 54 U/L   Alkaline Phosphatase 44 38 - 126 U/L   Total Bilirubin 0.2 (L) 0.3 - 1.2 mg/dL   GFR calc non Af Amer >60 >60 mL/min   GFR calc Af Amer >60 >60 mL/min    Comment: (NOTE) The eGFR has been calculated using the CKD EPI equation. This calculation has not been validated in all clinical situations. eGFR's persistently <60 mL/min signify possible Chronic Kidney Disease.    Anion gap 16 (H) 5 - 15  Ethanol     Status: None   Collection Time: 03/22/17  6:20 AM  Result Value Ref Range   Alcohol, Ethyl (B) <10 <10 mg/dL    Comment:        LOWEST DETECTABLE LIMIT FOR SERUM ALCOHOL IS  10 mg/dL FOR MEDICAL PURPOSES ONLY   Salicylate level     Status: None   Collection Time: 03/22/17  6:20 AM  Result Value Ref Range  Salicylate Lvl <2.8 2.8 - 30.0 mg/dL  Acetaminophen level     Status: Abnormal   Collection Time: 03/22/17  6:20 AM  Result Value Ref Range   Acetaminophen (Tylenol), Serum <10 (L) 10 - 30 ug/mL    Comment:        THERAPEUTIC CONCENTRATIONS VARY SIGNIFICANTLY. A RANGE OF 10-30 ug/mL MAY BE AN EFFECTIVE CONCENTRATION FOR MANY PATIENTS. HOWEVER, SOME ARE BEST TREATED AT CONCENTRATIONS OUTSIDE THIS RANGE. ACETAMINOPHEN CONCENTRATIONS >150 ug/mL AT 4 HOURS AFTER INGESTION AND >50 ug/mL AT 12 HOURS AFTER INGESTION ARE OFTEN ASSOCIATED WITH TOXIC REACTIONS.   Rapid urine drug screen (hospital performed)     Status: None   Collection Time: 03/22/17  6:20 AM  Result Value Ref Range   Opiates NONE DETECTED NONE DETECTED   Cocaine NONE DETECTED NONE DETECTED   Benzodiazepines NONE DETECTED NONE DETECTED   Amphetamines NONE DETECTED NONE DETECTED   Tetrahydrocannabinol NONE DETECTED NONE DETECTED   Barbiturates NONE DETECTED NONE DETECTED    Comment:        DRUG SCREEN FOR MEDICAL PURPOSES ONLY.  IF CONFIRMATION IS NEEDED FOR ANY PURPOSE, NOTIFY LAB WITHIN 5 DAYS.        LOWEST DETECTABLE LIMITS FOR URINE DRUG SCREEN Drug Class       Cutoff (ng/mL) Amphetamine      1000 Barbiturate      200 Benzodiazepine   638 Tricyclics       177 Opiates          300 Cocaine          300 THC              50   Urinalysis, Routine w reflex microscopic     Status: Abnormal   Collection Time: 03/22/17  6:20 AM  Result Value Ref Range   Color, Urine STRAW (A) YELLOW   APPearance CLEAR CLEAR   Specific Gravity, Urine 1.008 1.005 - 1.030   pH 6.0 5.0 - 8.0   Glucose, UA NEGATIVE NEGATIVE mg/dL   Hgb urine dipstick NEGATIVE NEGATIVE   Bilirubin Urine NEGATIVE NEGATIVE   Ketones, ur NEGATIVE NEGATIVE mg/dL   Protein, ur NEGATIVE NEGATIVE mg/dL   Nitrite  NEGATIVE NEGATIVE   Leukocytes, UA NEGATIVE NEGATIVE  Magnesium     Status: None   Collection Time: 03/22/17  6:20 AM  Result Value Ref Range   Magnesium 1.8 1.7 - 2.4 mg/dL  Troponin I     Status: None   Collection Time: 03/22/17  6:20 AM  Result Value Ref Range   Troponin I <0.03 <0.03 ng/mL  CK     Status: None   Collection Time: 03/22/17  6:27 AM  Result Value Ref Range   Total CK 53 38 - 234 U/L  I-Stat Beta hCG blood, ED ([facility], [facility], [facility] only)     Status: None   Collection Time: 03/22/17  6:30 AM  Result Value Ref Range   I-stat hCG, quantitative <5.0 <5 mIU/mL   Comment 3            Comment:   GEST. AGE      CONC.  (mIU/mL)   <=1 WEEK        5 - 50     2 WEEKS       50 - 500     3 WEEKS       100 - 10,000     4 WEEKS     1,000 - 30,000  FEMALE AND NON-PREGNANT FEMALE:     LESS THAN 5 mIU/mL   I-Stat CG4 Lactic Acid, ED     Status: Abnormal   Collection Time: 03/22/17  6:33 AM  Result Value Ref Range   Lactic Acid, Venous 9.29 (HH) 0.5 - 1.9 mmol/L   Comment NOTIFIED PHYSICIAN   I-stat chem 8, ed     Status: Abnormal   Collection Time: 03/22/17  6:38 AM  Result Value Ref Range   Sodium 143 135 - 145 mmol/L   Potassium 3.2 (L) 3.5 - 5.1 mmol/L   Chloride 99 (L) 101 - 111 mmol/L   BUN <3 (L) 6 - 20 mg/dL   Creatinine, Ser 0.50 0.44 - 1.00 mg/dL   Glucose, Bld 125 (H) 65 - 99 mg/dL   Calcium, Ion 0.95 (L) 1.15 - 1.40 mmol/L   TCO2 25 22 - 32 mmol/L   Hemoglobin 12.6 12.0 - 15.0 g/dL   HCT 37.0 36.0 - 46.0 %  Glucose, capillary     Status: None   Collection Time: 03/22/17  9:43 AM  Result Value Ref Range   Glucose-Capillary 65 65 - 99 mg/dL   Comment 1 Capillary Specimen   Glucose, capillary     Status: Abnormal   Collection Time: 03/22/17 10:13 AM  Result Value Ref Range   Glucose-Capillary 163 (H) 65 - 99 mg/dL   Comment 1 Capillary Specimen   Blood gas, arterial     Status: Abnormal   Collection Time: 03/22/17 10:53 AM  Result Value Ref Range   FIO2  35.00    Delivery systems VENTILATOR    Mode PRESSURE REGULATED VOLUME CONTROL    VT 400.0 mL   LHR 16.0 resp/min   Peep/cpap 5.0 cm H20   pH, Arterial 7.430 7.350 - 7.450   pCO2 arterial 34.8 32.0 - 48.0 mmHg   pO2, Arterial 185 (H) 83.0 - 108.0 mmHg   Bicarbonate 22.7 20.0 - 28.0 mmol/L   Acid-base deficit 1.0 0.0 - 2.0 mmol/L   O2 Saturation 99.4 %   Patient temperature 98.6    Collection site RIGHT RADIAL    Drawn by [id number]    Sample type ARTERIAL DRAW    Allens test (pass/fail) PASS PASS  Comprehensive metabolic panel     Status: Abnormal   Collection Time: 03/22/17 10:54 AM  Result Value Ref Range   Sodium 139 135 - 145 mmol/L   Potassium 3.4 (L) 3.5 - 5.1 mmol/L   Chloride 107 101 - 111 mmol/L   CO2 24 22 - 32 mmol/L   Glucose, Bld 104 (H) 65 - 99 mg/dL   BUN <5 (L) 6 - 20 mg/dL   Creatinine, Ser 0.77 0.44 - 1.00 mg/dL   Calcium 7.8 (L) 8.9 - 10.3 mg/dL   Total Protein 7.0 6.5 - 8.1 g/dL   Albumin 4.2 3.5 - 5.0 g/dL   AST 26 15 - 41 U/L   ALT 14 14 - 54 U/L   Alkaline Phosphatase 44 38 - 126 U/L   Total Bilirubin 0.7 0.3 - 1.2 mg/dL   GFR calc non Af Amer >60 >60 mL/min   GFR calc Af Amer >60 >60 mL/min    Comment: (NOTE) The eGFR has been calculated using the CKD EPI equation. This calculation has not been validated in all clinical situations. eGFR's persistently <60 mL/min signify possible Chronic Kidney Disease.    Anion gap 8 5 - 15  Magnesium     Status: None   Collection Time: 03/22/17  10:54 AM  Result Value Ref Range   Magnesium 2.3 1.7 - 2.4 mg/dL  Phosphorus     Status: Abnormal   Collection Time: 03/22/17 10:54 AM  Result Value Ref Range   Phosphorus 1.8 (L) 2.5 - 4.6 mg/dL  Lactic acid, plasma     Status: Abnormal   Collection Time: 03/22/17 10:54 AM  Result Value Ref Range   Lactic Acid, Venous 2.7 (HH) 0.5 - 1.9 mmol/L    Comment: CRITICAL RESULT CALLED TO, READ BACK BY AND VERIFIED WITH: J.CASSIDY RN @ 1240 03/22/17 BY C.EDENS   Troponin  I     Status: None   Collection Time: 03/22/17 10:54 AM  Result Value Ref Range   Troponin I <0.03 <0.03 ng/mL  CBC WITH DIFFERENTIAL     Status: Abnormal   Collection Time: 03/22/17 10:54 AM  Result Value Ref Range   WBC 12.4 (H) 4.0 - 10.5 K/uL   RBC 4.12 3.87 - 5.11 MIL/uL   Hemoglobin 12.8 12.0 - 15.0 g/dL   HCT 36.8 36.0 - 46.0 %   MCV 89.3 78.0 - 100.0 fL   MCH 31.1 26.0 - 34.0 pg   MCHC 34.8 30.0 - 36.0 g/dL   RDW 13.0 11.5 - 15.5 %   Platelets 230 150 - 400 K/uL   Neutrophils Relative % 85 %   Neutro Abs 10.5 (H) 1.7 - 7.7 K/uL   Lymphocytes Relative 9 %   Lymphs Abs 1.1 0.7 - 4.0 K/uL   Monocytes Relative 6 %   Monocytes Absolute 0.8 0.1 - 1.0 K/uL   Eosinophils Relative 0 %   Eosinophils Absolute 0.0 0.0 - 0.7 K/uL   Basophils Relative 0 %   Basophils Absolute 0.0 0.0 - 0.1 K/uL  Protime-INR     Status: None   Collection Time: 03/22/17 10:54 AM  Result Value Ref Range   Prothrombin Time 14.4 11.4 - 15.2 seconds   INR 1.13   Triglycerides     Status: None   Collection Time: 03/22/17 10:54 AM  Result Value Ref Range   Triglycerides 40 <150 mg/dL  MRSA PCR Screening     Status: None   Collection Time: 03/22/17 11:49 AM  Result Value Ref Range   MRSA by PCR NEGATIVE NEGATIVE    Comment:        The GeneXpert MRSA Assay (FDA approved for NASAL specimens only), is one component of a comprehensive MRSA colonization surveillance program. It is not intended to diagnose MRSA infection nor to guide or monitor treatment for MRSA infections.   Glucose, capillary     Status: Abnormal   Collection Time: 03/22/17 12:32 PM  Result Value Ref Range   Glucose-Capillary 47 (L) 65 - 99 mg/dL  Glucose, capillary     Status: None   Collection Time: 03/22/17  1:05 PM  Result Value Ref Range   Glucose-Capillary 99 65 - 99 mg/dL  Troponin I     Status: Abnormal   Collection Time: 03/22/17  2:27 PM  Result Value Ref Range   Troponin I 0.03 (HH) <0.03 ng/mL    Comment:  CRITICAL VALUE NOTED.  VALUE IS CONSISTENT WITH PREVIOUSLY REPORTED AND CALLED VALUE.  Magnesium     Status: None   Collection Time: 03/22/17  2:27 PM  Result Value Ref Range   Magnesium 2.2 1.7 - 2.4 mg/dL  Phosphorus     Status: Abnormal   Collection Time: 03/22/17  2:27 PM  Result Value Ref Range   Phosphorus 2.4 (  L) 2.5 - 4.6 mg/dL  Glucose, capillary     Status: Abnormal   Collection Time: 03/22/17  4:27 PM  Result Value Ref Range   Glucose-Capillary 41 (LL) 65 - 99 mg/dL   Comment 1 Notify RN   Lactic acid, plasma     Status: Abnormal   Collection Time: 03/22/17  4:41 PM  Result Value Ref Range   Lactic Acid, Venous 2.0 (HH) 0.5 - 1.9 mmol/L    Comment: CRITICAL RESULT CALLED TO, READ BACK BY AND VERIFIED WITH: N.BEASLEY,RN 10.21.18 1747   Magnesium     Status: None   Collection Time: 03/22/17  4:41 PM  Result Value Ref Range   Magnesium 2.1 1.7 - 2.4 mg/dL  Phosphorus     Status: Abnormal   Collection Time: 03/22/17  4:41 PM  Result Value Ref Range   Phosphorus 2.2 (L) 2.5 - 4.6 mg/dL  Glucose, capillary     Status: Abnormal   Collection Time: 03/22/17  4:52 PM  Result Value Ref Range   Glucose-Capillary 261 (H) 65 - 99 mg/dL  Glucose, capillary     Status: Abnormal   Collection Time: 03/22/17  5:15 PM  Result Value Ref Range   Glucose-Capillary 104 (H) 65 - 99 mg/dL  Glucose, capillary     Status: None   Collection Time: 03/22/17  6:20 PM  Result Value Ref Range   Glucose-Capillary 68 65 - 99 mg/dL  Glucose, capillary     Status: Abnormal   Collection Time: 03/22/17  7:52 PM  Result Value Ref Range   Glucose-Capillary 48 (L) 65 - 99 mg/dL   Comment 1 Capillary Specimen   Glucose, capillary     Status: Abnormal   Collection Time: 03/22/17  8:17 PM  Result Value Ref Range   Glucose-Capillary 111 (H) 65 - 99 mg/dL   Comment 1 Notify RN   Troponin I     Status: None   Collection Time: 03/22/17  9:52 PM  Result Value Ref Range   Troponin I <0.03 <0.03 ng/mL   Glucose, capillary     Status: Abnormal   Collection Time: 03/22/17 11:40 PM  Result Value Ref Range   Glucose-Capillary 63 (L) 65 - 99 mg/dL   Comment 1 Notify RN   Glucose, capillary     Status: Abnormal   Collection Time: 03/23/17 12:06 AM  Result Value Ref Range   Glucose-Capillary 132 (H) 65 - 99 mg/dL  I-STAT 3, arterial blood gas (G3+)     Status: Abnormal   Collection Time: 03/23/17  3:35 AM  Result Value Ref Range   pH, Arterial 7.336 (L) 7.350 - 7.450   pCO2 arterial 40.0 32.0 - 48.0 mmHg   pO2, Arterial 144.0 (H) 83.0 - 108.0 mmHg   Bicarbonate 21.4 20.0 - 28.0 mmol/L   TCO2 23 22 - 32 mmol/L   O2 Saturation 99.0 %   Acid-base deficit 4.0 (H) 0.0 - 2.0 mmol/L   Patient temperature 98.6 F    Collection site RADIAL, ALLEN'S TEST ACCEPTABLE    Drawn by Operator    Sample type ARTERIAL   Glucose, capillary     Status: Abnormal   Collection Time: 03/23/17  3:38 AM  Result Value Ref Range   Glucose-Capillary 110 (H) 65 - 99 mg/dL   Comment 1 Arterial Specimen   CBC     Status: Abnormal   Collection Time: 03/23/17  5:43 AM  Result Value Ref Range   WBC 11.3 (H) 4.0 - 10.5  K/uL   RBC 3.60 (L) 3.87 - 5.11 MIL/uL   Hemoglobin 10.9 (L) 12.0 - 15.0 g/dL   HCT 33.0 (L) 36.0 - 46.0 %   MCV 91.7 78.0 - 100.0 fL   MCH 30.3 26.0 - 34.0 pg   MCHC 33.0 30.0 - 36.0 g/dL   RDW 13.3 11.5 - 15.5 %   Platelets 203 150 - 400 K/uL  Basic metabolic panel     Status: Abnormal   Collection Time: 03/23/17  5:43 AM  Result Value Ref Range   Sodium 137 135 - 145 mmol/L   Potassium 4.3 3.5 - 5.1 mmol/L   Chloride 109 101 - 111 mmol/L   CO2 20 (L) 22 - 32 mmol/L   Glucose, Bld 101 (H) 65 - 99 mg/dL   BUN 5 (L) 6 - 20 mg/dL   Creatinine, Ser 0.62 0.44 - 1.00 mg/dL   Calcium 7.9 (L) 8.9 - 10.3 mg/dL   GFR calc non Af Amer >60 >60 mL/min   GFR calc Af Amer >60 >60 mL/min    Comment: (NOTE) The eGFR has been calculated using the CKD EPI equation. This calculation has not been validated  in all clinical situations. eGFR's persistently <60 mL/min signify possible Chronic Kidney Disease.    Anion gap 8 5 - 15  Magnesium     Status: None   Collection Time: 03/23/17  5:43 AM  Result Value Ref Range   Magnesium 1.8 1.7 - 2.4 mg/dL  Phosphorus     Status: None   Collection Time: 03/23/17  5:43 AM  Result Value Ref Range   Phosphorus 3.4 2.5 - 4.6 mg/dL  Glucose, capillary     Status: None   Collection Time: 03/23/17  8:51 AM  Result Value Ref Range   Glucose-Capillary 70 65 - 99 mg/dL    Current Facility-Administered Medications  Medication Dose Route Frequency Provider Last Rate Last Dose  . albuterol (PROVENTIL) (2.5 MG/3ML) 0.083% nebulizer solution 2.5 mg  2.5 mg Nebulization Q2H PRN Hammonds, Sharyn Blitz, MD      . chlorhexidine (PERIDEX) 0.12 % solution 15 mL  15 mL Mouth/Throat BID Rigoberto Noel, MD   15 mL at 03/23/17 0758  . chlorhexidine gluconate (MEDLINE KIT) (PERIDEX) 0.12 % solution 15 mL  15 mL Mouth Rinse BID Hammonds, Sharyn Blitz, MD   15 mL at 03/22/17 2000  . dextrose 5 % in lactated ringers infusion   Intravenous Continuous Erick Colace, NP 75 mL/hr at 03/23/17 1045    . docusate (COLACE) 50 MG/5ML liquid 100 mg  100 mg Per Tube BID PRN Hammonds, Sharyn Blitz, MD      . heparin injection 5,000 Units  5,000 Units Subcutaneous Q8H Hammonds, Sharyn Blitz, MD   5,000 Units at 03/23/17 0556  . MEDLINE mouth rinse  15 mL Mouth Rinse 10 times per day Hammonds, Sharyn Blitz, MD   15 mL at 03/23/17 0915  . sodium bicarbonate injection 100 mEq  100 mEq Intravenous Once Rancour, Annie Main, MD        Musculoskeletal: Strength & Muscle Tone: decreased Gait & Station: unable to stand Patient leans: N/A  Psychiatric Specialty Exam: Physical Exam  ROS  Blood pressure 123/86, pulse (!) 127, temperature (!) 100.9 F (38.3 C), temperature source Oral, resp. rate (!) 22, height _0  (1.575 m), weight 46.2 kg (101 lb 13.6 oz), SpO2 100 %.Body mass index is 18.63 kg/m.   General Appearance: Guarded  Eye Contact:  Good  Speech:  Slow  Volume:  Decreased  Mood:  Anxious, Depressed, Hopeless and Worthless  Affect:  Depressed and Tearful  Thought Process:  Coherent and Goal Directed  Orientation:  Full (Time, Place, and Person)  Thought Content:  Rumination  Suicidal Thoughts:  Yes.  with intent/plan  Homicidal Thoughts:  No  Memory:  Immediate;   Fair Recent;   Fair Remote;   Fair  Judgement:  Impaired  Insight:  Fair  Psychomotor Activity:  Decreased  Concentration:  Concentration: Fair and Attention Span: Fair  Recall:  [employer] of Knowledge:  Good  Language:  Good  Akathisia:  Negative  Handed:  Right  AIMS (if indicated):     Assets:  Communication Skills Desire for Improvement Housing Leisure Time Resilience Social Support Transportation  ADL's:  Impaired  Cognition:  WNL  Sleep:        Treatment Plan Summary: 21 years old female with a history of past acute psychiatric hospitalization for psychosis, presented with Benadryl overdose as a suicide attempt and required intubation on arrival and currently she was extubated. Patient meets criteria for acute inpatient psychiatric hospitalization when medically stable.  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 No psychotropic medication until medically cleared Daily contact with patient to assess and evaluate symptoms and progress in treatment and Medication management  Disposition: Recommend psychiatric Inpatient admission when medically cleared. Supportive therapy provided about ongoing stressors.  Ambrose Finland, MD 03/23/2017 10:54 AM

## 2017-03-23 NOTE — Progress Notes (Signed)
Chaplain visited with patient and allowed patient to share things that were on her mind that led to her being here.  Patient involved in chat rooms and video chat rooms.  Allowed patient to talk about this experience and allowed her to talk about how a conversation with her mom may go.  Chaplain will continue to follow and care for this patient as per her request.  Thank you to the medical team for helping this patient to recover.  Grateful for you.    03/23/17 1425  Clinical Encounter Type  Visited With Patient  Visit Type Initial;Psychological support;Spiritual support;Social support

## 2017-03-23 NOTE — Progress Notes (Signed)
Patient heart rate in the 120-130s. Mannam, MD called and notified. No new orders given at this time. Will continue to monitor patient.

## 2017-03-24 DIAGNOSIS — T50904A Poisoning by unspecified drugs, medicaments and biological substances, undetermined, initial encounter: Secondary | ICD-10-CM

## 2017-03-24 DIAGNOSIS — R569 Unspecified convulsions: Secondary | ICD-10-CM

## 2017-03-24 LAB — BASIC METABOLIC PANEL
ANION GAP: 6 (ref 5–15)
CALCIUM: 8.2 mg/dL — AB (ref 8.9–10.3)
CO2: 26 mmol/L (ref 22–32)
Chloride: 106 mmol/L (ref 101–111)
Creatinine, Ser: 0.52 mg/dL (ref 0.44–1.00)
GFR calc Af Amer: >60 mL/min (ref >60)
GFR calc non Af Amer: >60 mL/min (ref >60)
GLUCOSE: 101 mg/dL — AB (ref 65–99)
Potassium: 3 mmol/L — ABNORMAL LOW (ref 3.5–5.1)
Sodium: 138 mmol/L (ref 135–145)

## 2017-03-24 LAB — GLUCOSE, CAPILLARY
GLUCOSE-CAPILLARY: 137 mg/dL — AB (ref 65–99)
GLUCOSE-CAPILLARY: 82 mg/dL (ref 65–99)
Glucose-Capillary: 104 mg/dL — ABNORMAL HIGH (ref 65–99)
Glucose-Capillary: 81 mg/dL (ref 65–99)
Glucose-Capillary: 107 mg/dL — ABNORMAL HIGH (ref 65–99)

## 2017-03-24 LAB — CBC
HCT: 31.8 % — ABNORMAL LOW (ref 36.0–46.0)
HEMOGLOBIN: 10.4 g/dL — AB (ref 12.0–15.0)
MCH: 29.7 pg (ref 26.0–34.0)
MCHC: 32.7 g/dL (ref 30.0–36.0)
MCV: 90.9 fL (ref 78.0–100.0)
PLATELETS: 195 10*3/uL (ref 150–400)
RBC: 3.5 MIL/uL — AB (ref 3.87–5.11)
RDW: 13 % (ref 11.5–15.5)
WBC: 9.7 10*3/uL (ref 4.0–10.5)

## 2017-03-24 MED ORDER — MENTHOL 3 MG MT LOZG
1.0000 | LOZENGE | OROMUCOSAL | Status: DC | PRN
  Filled 2017-03-24: qty 9

## 2017-03-24 MED ORDER — ACETAMINOPHEN 325 MG PO TABS
650.0000 mg | ORAL_TABLET | Freq: Four times a day (QID) | ORAL | Status: DC | PRN
  Administered 2017-03-24: 650 mg via ORAL
  Filled 2017-03-24: qty 2

## 2017-03-24 MED ORDER — POTASSIUM CHLORIDE CRYS ER 20 MEQ PO TBCR
40.0000 meq | EXTENDED_RELEASE_TABLET | Freq: Once | ORAL | Status: AC
  Administered 2017-03-24: 40 meq via ORAL
  Filled 2017-03-24: qty 2

## 2017-03-24 MED ORDER — POTASSIUM CHLORIDE CRYS ER 20 MEQ PO TBCR
20.0000 meq | EXTENDED_RELEASE_TABLET | Freq: Once | ORAL | Status: AC
  Administered 2017-03-24: 20 meq via ORAL
  Filled 2017-03-24: qty 1

## 2017-03-24 MED ORDER — DOCUSATE SODIUM 50 MG/5ML PO LIQD: 100.0000 mg | Freq: Two times a day (BID) | ORAL | Status: DC | PRN

## 2017-03-24 NOTE — Care Management Note (Signed)
Case Management Note  Patient Details  Name: Stacie Douglas MRN: [id number] Date of Birth: 01/25/1996  Subjective/Objective:                 Patient from home, admitted for intentional OD on Benadryl. Initially intubated, and [facility]. CM placed CSW consult to assist with this dispo.    Action/Plan:  CM/ CSW will continue to follow for DC planning. Expected Discharge Date:                  Expected Discharge Plan:  [facility]  In-House Referral:     Discharge planning Services  CM Consult  Post Acute Care Choice:    Choice offered to:     DME Arranged:    DME Agency:     HH Arranged:    HH Agency:     Status of Service:  In process, will continue to follow  If discussed at Long Length of Stay Meetings, dates discussed:    Additional Comments:  Lawerance SabalDebbie Aniyah Nobis, RN 03/24/2017, 3:02 PM

## 2017-03-24 NOTE — Progress Notes (Signed)
[facility] TEAM 1 - Stepdown/ICU TEAM  Barrie FolkKaren Cobaugh  ZOX:[id number] DOB: 10/15/1995 DOA: 03/22/2017 [facility]    Brief Narrative:  [id number] F with hx GERD who presented 10/22 as a transfer from [facility] where she presented on 10/21 after being found down in her house. Her parents last saw her normal on 10/21 at 1:30am. At approx 5:30am they found her on the floor unresponsive next to an empty bottle of benadryl. EMS was called and reportedly she had a seizure with EMS as well as another seizure at the outside hospital ER. She was tachycardic in the 170's with wide QRS and prolonged QT. She was given Magnesium and Bicarb with improvement of both. She was intubated and [facility]. The family reported she took ~70 tabs of benadryl - and they also noted an ibuprofen bottle was missing.   Significant Events: [facility] 10/22 extubated  10/23 [facility] assumed care   Subjective: The patient is resting comfortably in bed.  She denies chest pain shortness breath fevers chills nausea vomiting.  She reports a good appetite.  She is alert oriented and conversant.  Assessment & Plan:  Intentional drug OD - Benadryl and presumed ibuprofen Aspirin APAP alcohol and UDS were negative - CT head negative - clinically stabilized at this time   Prolonged QTC - SVT & Sinus Tachycardia Initial QTC was 600 - treated with magnesium and bicarbonate - prolonged QTc resolved on f/u EKG - sinus tachycardia persists - follow on tele w/o tx for now   Acute encephalopathy in the setting of drug overdose Resolved w/ clearance of offending agent from system  Reported seizure no further episodes noted - monitor - no further testing indicated unless sx recur   Acute respiratory failure in the setting of altered mental status following drug overdose rapidly extubated - no persisting respiratory issues - some sore throat to be  expected post intubation/extubation   Evolving metabolic acidosis, non-anion gap due to progressive hyperchloremia with saline resuscitation - resolved w/ correction   Hypokalemia  Supplement and follow  Hypoglycemia Resolved w/ resumption of diet   Mild anemia suspect hemo-dilution - no evidence of bleeding  Low-grade fever and leukocytosis Resolving - no evidence of active infection   DVT prophylaxis: SCDs - ambulation  Code Status: FULL CODE Family Communication: no family present at time of exam  Disposition Plan: ready for placement in an inpatient psychiatric facility   Consultants:  PCCM Neurology  Psychiatry   Antimicrobials:  none  Objective: Blood pressure 104/67, pulse (!) 117, temperature 98.9 F (37.2 C), temperature source Oral, resp. rate 18, height 5\' 6"  (1.676 m), weight 45.3 kg (99 lb 13.9 oz), SpO2 100 %.  Intake/Output Summary (Last 24 hours) at 03/24/17 0909 Last data filed at 03/24/17 0900  Gross per 24 hour  Intake             1720 ml  Output             3150 ml  Net            -1430 ml   Filed Weights   03/23/17 0400 03/23/17 1400 03/24/17 0400  Weight: 46.2 kg (101 lb 13.6 oz) 46.4 kg (102 lb 4.8 oz) 45.3 kg (99 lb 13.9 oz)    Examination: General: No acute respiratory distress Lungs: Clear to auscultation bilaterally without wheezes or crackles Cardiovascular: Tachycardic but regular with no appreciable  murmur gallop or rub Abdomen: Nontender, nondistended, soft, bowel sounds positive, no rebound, no ascites, no appreciable mass Extremities: No significant cyanosis, clubbing, or edema bilateral lower extremities  CBC:  Recent Labs Lab 03/22/17 0620 03/22/17 0638 03/22/17 1054 03/23/17 0543 03/24/17 0251  WBC 9.2  --  12.4* 11.3* 9.7  NEUTROABS 7.2  --  10.5*  --   --   HGB 12.7 12.6 12.8 10.9* 10.4*  HCT 37.1 37.0 36.8 33.0* 31.8*  MCV 88.1  --  89.3 91.7 90.9  PLT 235  --  230 203 195   Basic Metabolic Panel:  Recent  Labs Lab 03/22/17 0620 03/22/17 0638 03/22/17 1054 03/22/17 1427 03/22/17 1641 03/23/17 0543 03/23/17 1846 03/24/17 0251  NA 142 143 139  --   --  137  --  138  K 3.4* 3.2* 3.4*  --   --  4.3  --  3.0*  CL 102 99* 107  --   --  109  --  106  CO2 24  --  24  --   --  20*  --  26  GLUCOSE 123* 125* 104*  --   --  101*  --  101*  BUN 6 <3* <5*  --   --  5*  --  <5*  CREATININE 0.72 0.50 0.77  --   --  0.62  --  0.52  CALCIUM 8.4*  --  7.8*  --   --  7.9*  --  8.2*  MG 1.8  --  2.3 2.2 2.1 1.8 1.7  --   PHOS  --   --  1.8* 2.4* 2.2* 3.4 2.9  --    GFR: Estimated Creatinine Clearance: 80.2 mL/min (by C-G formula based on SCr of 0.52 mg/dL).  Liver Function Tests:  Recent Labs Lab 03/22/17 0620 03/22/17 1054  AST 29 26  ALT 12* 14  ALKPHOS 44 44  BILITOT 0.2* 0.7  PROT 6.7 7.0  ALBUMIN 3.9 4.2    Coagulation Profile:  Recent Labs Lab 03/22/17 1054  INR 1.13    Cardiac Enzymes:  Recent Labs Lab 03/22/17 0620 03/22/17 0627 03/22/17 1054 03/22/17 1427 03/22/17 2152  CKTOTAL  --  53  --   --   --   TROPONINI <0.03  --  <0.03 0.03* <0.03    CBG:  Recent Labs Lab 03/23/17 1129 03/23/17 1603 03/23/17 2011 03/24/17 0338 03/24/17 0803  GLUCAP 87 84 82 104* 82    Recent Results (from the past 240 hour(s))  MRSA PCR Screening     Status: None   Collection Time: 03/22/17 11:49 AM  Result Value Ref Range Status   MRSA by PCR NEGATIVE NEGATIVE Final    Comment:        The GeneXpert MRSA Assay (FDA approved for NASAL specimens only), is one component of a comprehensive MRSA colonization surveillance program. It is not intended to diagnose MRSA infection nor to guide or monitor treatment for MRSA infections.      Scheduled Meds: . heparin  5,000 Units Subcutaneous Q8H   Continuous Infusions: . dextrose 5% lactated ringers 75 mL/hr at 03/24/17 0900     LOS: 2 days   Lonia Blood, MD [facility] Office  [phone] Pager -  Text Page per Loretha Stapler as per below:  On-Call/Text Page:      [email or web address]      password [email or web address]  If 7PM-7AM, please contact night-coverage [email or web address] Password [email or web address] 03/24/2017, 9:09 AM

## 2017-03-24 NOTE — Progress Notes (Signed)
eLink Physician-Brief Progress Note Patient Name: Stacie Douglas DOB: 08/04/1995 MRN: [id number]   Date of Service  03/24/2017  HPI/Events of Note    eICU Interventions  Hypokalemia -repleted      Intervention Category Intermediate Interventions: Electrolyte abnormality - evaluation and management  ALVA,RAKESH V. 03/24/2017, 4:33 AM

## 2017-03-25 ENCOUNTER — Inpatient Hospital Stay (HOSPITAL_COMMUNITY)
Admission: AD | Admit: 2017-03-25 | Discharge: 2017-04-02 | DRG: 885 | Disposition: A | Discharge status: Home / Self Care | Payer: Medicaid Other | Source: Intra-hospital | Attending: Psychiatry | Admitting: Psychiatry

## 2017-03-25 ENCOUNTER — Encounter (HOSPITAL_COMMUNITY): Payer: Self-pay

## 2017-03-25 DIAGNOSIS — Z810 Family history of intellectual disabilities: Secondary | ICD-10-CM | POA: Diagnosis not present

## 2017-03-25 DIAGNOSIS — T450X1A Poisoning by antiallergic and antiemetic drugs, accidental (unintentional), initial encounter: Secondary | ICD-10-CM | POA: Diagnosis present

## 2017-03-25 DIAGNOSIS — G4700 Insomnia, unspecified: Secondary | ICD-10-CM | POA: Diagnosis present

## 2017-03-25 DIAGNOSIS — J9601 Acute respiratory failure with hypoxia: Secondary | ICD-10-CM | POA: Diagnosis not present

## 2017-03-25 DIAGNOSIS — J9600 Acute respiratory failure, unspecified whether with hypoxia or hypercapnia: Secondary | ICD-10-CM | POA: Diagnosis not present

## 2017-03-25 DIAGNOSIS — K219 Gastro-esophageal reflux disease without esophagitis: Secondary | ICD-10-CM | POA: Diagnosis present

## 2017-03-25 DIAGNOSIS — T1491XA Suicide attempt, initial encounter: Secondary | ICD-10-CM | POA: Diagnosis not present

## 2017-03-25 DIAGNOSIS — F3130 Bipolar disorder, current episode depressed, mild or moderate severity, unspecified: Secondary | ICD-10-CM | POA: Diagnosis present

## 2017-03-25 DIAGNOSIS — F419 Anxiety disorder, unspecified: Secondary | ICD-10-CM | POA: Diagnosis present

## 2017-03-25 DIAGNOSIS — Z915 Personal history of self-harm: Secondary | ICD-10-CM

## 2017-03-25 DIAGNOSIS — F17290 Nicotine dependence, other tobacco product, uncomplicated: Secondary | ICD-10-CM | POA: Diagnosis present

## 2017-03-25 DIAGNOSIS — Z23 Encounter for immunization: Secondary | ICD-10-CM | POA: Diagnosis not present

## 2017-03-25 DIAGNOSIS — T450X2A Poisoning by antiallergic and antiemetic drugs, intentional self-harm, initial encounter: Secondary | ICD-10-CM | POA: Diagnosis present

## 2017-03-25 DIAGNOSIS — R45851 Suicidal ideations: Secondary | ICD-10-CM | POA: Diagnosis not present

## 2017-03-25 DIAGNOSIS — F39 Unspecified mood [affective] disorder: Secondary | ICD-10-CM | POA: Diagnosis not present

## 2017-03-25 LAB — BASIC METABOLIC PANEL
ANION GAP: 8 (ref 5–15)
BUN: <5 mg/dL — ABNORMAL LOW (ref 6–20)
CO2: 24 mmol/L (ref 22–32)
Calcium: 8.9 mg/dL (ref 8.9–10.3)
Chloride: 106 mmol/L (ref 101–111)
Creatinine, Ser: 0.47 mg/dL (ref 0.44–1.00)
Glucose, Bld: 87 mg/dL (ref 65–99)
POTASSIUM: 3.8 mmol/L (ref 3.5–5.1)
SODIUM: 138 mmol/L (ref 135–145)

## 2017-03-25 LAB — MAGNESIUM: MAGNESIUM: 1.6 mg/dL — AB (ref 1.7–2.4)

## 2017-03-25 LAB — TSH: TSH: 0.563 u[IU]/mL (ref 0.350–4.500)

## 2017-03-25 LAB — PREGNANCY, URINE: PREG TEST UR: NEGATIVE

## 2017-03-25 MED ORDER — MAGNESIUM HYDROXIDE 400 MG/5ML PO SUSP: 30.0000 mL | Freq: Every day | ORAL | Status: DC | PRN

## 2017-03-25 MED ORDER — MAGNESIUM SULFATE 2 GM/50ML IV SOLN
2.0000 g | Freq: Once | INTRAVENOUS | Status: AC
  Administered 2017-03-25: 2 g via INTRAVENOUS
  Filled 2017-03-25: qty 50

## 2017-03-25 MED ORDER — POTASSIUM CHLORIDE CRYS ER 20 MEQ PO TBCR
30.0000 meq | EXTENDED_RELEASE_TABLET | Freq: Once | ORAL | Status: AC
  Administered 2017-03-25: 30 meq via ORAL
  Filled 2017-03-25: qty 1

## 2017-03-25 MED ORDER — POLYETHYLENE GLYCOL 3350 17 G PO PACK: 17.0000 g | PACK | Freq: Every day | ORAL | 0 refills | Status: AC

## 2017-03-25 MED ORDER — INFLUENZA VAC SPLIT QUAD 0.5 ML IM SUSY
0.5000 mL | PREFILLED_SYRINGE | INTRAMUSCULAR | Status: AC
  Administered 2017-03-26: 0.5 mL via INTRAMUSCULAR
  Filled 2017-03-25: qty 0.5

## 2017-03-25 MED ORDER — MENTHOL 3 MG MT LOZG
1.0000 | LOZENGE | OROMUCOSAL | Status: DC | PRN
  Administered 2017-03-27: 3 mg via ORAL

## 2017-03-25 MED ORDER — POLYETHYLENE GLYCOL 3350 17 G PO PACK
17.0000 g | PACK | Freq: Every day | ORAL | Status: DC
  Filled 2017-03-25 (×10): qty 1

## 2017-03-25 MED ORDER — SENNOSIDES-DOCUSATE SODIUM 8.6-50 MG PO TABS: 1.0000 | ORAL_TABLET | Freq: Every evening | ORAL | Status: DC | PRN

## 2017-03-25 MED ORDER — TRAZODONE HCL 50 MG PO TABS
50.0000 mg | ORAL_TABLET | Freq: Every evening | ORAL | Status: DC | PRN
  Administered 2017-03-25: 50 mg via ORAL
  Filled 2017-03-25 (×5): qty 1

## 2017-03-25 MED ORDER — ALUM & MAG HYDROXIDE-SIMETH 200-200-20 MG/5ML PO SUSP
30.0000 mL | ORAL | Status: DC | PRN
  Administered 2017-03-31 – 2017-04-01 (×3): 30 mL via ORAL
  Filled 2017-03-25 (×3): qty 30

## 2017-03-25 MED ORDER — POLYETHYLENE GLYCOL 3350 17 G PO PACK
17.0000 g | PACK | Freq: Every day | ORAL | Status: DC
  Administered 2017-03-25: 17 g via ORAL
  Filled 2017-03-25: qty 1

## 2017-03-25 NOTE — Progress Notes (Signed)
Responded to  Spiritual care consult for suicide attempt. Patient very open. She identified many sources of stress in her life. She wants to get her HS Diploma or GED and is home-schooled (21 yrs old). She is responsible for 2 younger autistic siblings who require lots of attention to prevent destruction and to have basic needs met.  She is overwhelmed with this and feels like she is the go to person -mom and dad confide in her about their problems with one another. She also mentioned some break ups from online boyfriends. She shared she really wants to live. She is trying to escape all the stress and overwhelming feelings of responsibility at home. She called it having a breakdown. Easy to talk with and listen to.  She really needs help developing coping mechanisms. I suggested she might consider making a list of all the things that are making her feel stressed and sharing that with parents, doctors, etc. Also shared to be sure to be open to resources available such as therapy, medication, etc as it can be very helpful. She feels stuck and sees no way out of her situation.  Conard Novak, Chaplain   03/25/17 1100  Clinical Encounter Type  Visited With Patient  Visit Type Initial;Psychological support;Spiritual support  Referral From Physician  Spiritual Encounters  Spiritual Needs Sacred text;Prayer;Emotional;Other (Comment)  Stress Factors  Patient Stress Factors Exhausted;Family relationships;Other (Comment)  Family Stress Factors Other (Comment) (Overwhelmed caring for siblings and keeping peace w/parents)
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
## 2017-03-25 NOTE — Tx Team (Signed)
Initial Treatment Plan 03/25/2017 5:49 PM Stacie Douglas ZOX:[id number]    PATIENT STRESSORS: Financial difficulties Other: Helping take care of autistic siblings   PATIENT STRENGTHS: Communication skills General fund of knowledge Physical Health Supportive family/friends Work skills   PATIENT IDENTIFIED PROBLEMS: Depression  Suicidal ideation  "Help with my depression"  "Help me cope better"               DISCHARGE CRITERIA:  Improved stabilization in mood, thinking, and/or behavior Verbal commitment to aftercare and medication compliance  PRELIMINARY DISCHARGE PLAN: Outpatient therapy Medication management  PATIENT/FAMILY INVOLVEMENT: This treatment plan has been presented to and reviewed with the patient, Stacie Douglas.  The patient and family have been given the opportunity to ask questions and make suggestions.  Levin BaconHeather V Amirra Herling, RN 03/25/2017, 5:49 PM

## 2017-03-25 NOTE — Progress Notes (Signed)
[facility] 2 Anticipated DC date: 03/25/17  Family notified: Pt requested mother to be notified. CSW [facility].  Transport by: [facility] (next available)   Per MD [facility]. RN, patient, patient's family, and facility notified of DC. Discharge Summary sent to facility. RN given number for report (report already called). Voluntary consent signed and faxed.   CSW signing off.  Stacie GoldmannNadia Jahiem Douglas, [place]CSWA Clinical Social Worker [phone]

## 2017-03-25 NOTE — Progress Notes (Signed)
D: Pt was in the hallway upon initial approach.  Pt presents with depressed affect and mood.  Her goal is to "overcome this cough."  Pt denies SI/HI, denies hallucinations, reports pain from headache of 3/10.  Pt has been visible in milieu interacting with peers and staff cautiously.  Pt did not attend evening group.  A: Introduced self to pt.  Actively listened to pt and offered support and encouragement. Pt requested sleep medication and onsite provider was notified.  Trazodone 50 mg PO was ordered and administered.  Q15 minute safety checks maintained.  R: Pt is safe on the unit.  Pt is compliant with medication.  Pt verbally contracts for safety.  Will continue to monitor and assess.

## 2017-03-25 NOTE — Progress Notes (Signed)
Pt did not attend wrap-up group   

## 2017-03-25 NOTE — Progress Notes (Addendum)
1:30pm: [facility] can accept patient after she has a urine pregnancy screen and UDS. Do not have to wait for it to be read.   11:53am: [facility] They will review and call CSW back.   Osborne Cascoadia Anjeanette Petzold LCSWA [phone]

## 2017-03-25 NOTE — Progress Notes (Signed)
[facility] [place] [facility]

## 2017-03-25 NOTE — Plan of Care (Signed)
Problem: Safety: Goal: Periods of time without injury will increase Outcome: Progressing Pt has not harmed self or others tonight.  She denies SI/HI and verbally contracts for safety.    

## 2017-03-26 DIAGNOSIS — F3130 Bipolar disorder, current episode depressed, mild or moderate severity, unspecified: Principal | ICD-10-CM

## 2017-03-26 MED ORDER — TRAZODONE HCL 50 MG PO TABS
50.0000 mg | ORAL_TABLET | Freq: Every evening | ORAL | Status: DC | PRN
  Administered 2017-03-26 – 2017-03-31 (×3): 50 mg via ORAL
  Filled 2017-03-26 (×4): qty 1

## 2017-03-26 MED ORDER — ACETAMINOPHEN 325 MG PO TABS
650.0000 mg | ORAL_TABLET | Freq: Four times a day (QID) | ORAL | Status: DC | PRN
  Administered 2017-03-26: 650 mg via ORAL

## 2017-03-26 MED ORDER — ACETAMINOPHEN 325 MG PO TABS
ORAL_TABLET | ORAL | Status: AC
  Filled 2017-03-26: qty 2

## 2017-03-26 MED ORDER — ACETAMINOPHEN 325 MG PO TABS
650.0000 mg | ORAL_TABLET | Freq: Once | ORAL | Status: AC
  Filled 2017-03-26: qty 2

## 2017-03-26 MED ORDER — OLANZAPINE 2.5 MG PO TABS
2.5000 mg | ORAL_TABLET | Freq: Every day | ORAL | Status: DC
  Administered 2017-03-26 – 2017-03-28 (×3): 2.5 mg via ORAL
  Filled 2017-03-26 (×4): qty 1

## 2017-03-26 MED ORDER — SERTRALINE HCL 25 MG PO TABS
25.0000 mg | ORAL_TABLET | Freq: Every day | ORAL | Status: DC
  Administered 2017-03-26 – 2017-03-28 (×2): 25 mg via ORAL
  Filled 2017-03-26 (×5): qty 1

## 2017-03-26 MED ORDER — ENSURE ENLIVE PO LIQD
237.0000 mL | Freq: Three times a day (TID) | ORAL | Status: DC
  Administered 2017-03-26 – 2017-04-01 (×16): 237 mL via ORAL
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
## 2017-03-26 NOTE — Progress Notes (Signed)
D: Pt was in at the nurse's station upon initial approach.  Pt presents with depressed affect and mood; smiles upon approach.  Her goal is to "get a good night's sleep."  She reports her day was "pretty good, I was sad some parts of the day."  She reports the best part of her day was "when we went to the gym."  Pt denies SI/HI, denies hallucinations, reports pain from headache of 5/10.  Pt has been visible in milieu interacting with peers and staff appropriately.  Pt attended evening group.    A: Introduced self to pt.  Actively listened to pt and offered support and encouragement. Medications administered per order.  PRN medication administered for pain.  Q15 minute safety checks maintained.  Pt was provided with ginger ale after she complained of nausea.    R: Pt is safe on the unit.  Pt is compliant with medications.  She reports feeling less nauseous after drinking ginger ale.  Pt verbally contracts for safety.  Will continue to monitor and assess.

## 2017-03-26 NOTE — Progress Notes (Signed)
D:  Patient's self inventory sheet, patient has fair sleep, sleep medication helpful.  Fair appetite, low energy level, good concentration.  Rated depression 6, hopeless 5, anxiety 4.  Withdrawals chilling, nausea, runny nose, congestion.  Denied SI.  Physical problems, lightheaded, pain, headaches, slight stomach sickness.  Physical pain, worst pain #3, head, throat.  Denied pain medication.  Goal is overcoming, stopping depressive, racing thoughts, fighting depression.  Plans to talk to patients, engage in activities.  No discharge plans. A:  Medications administered per MD orders.  Emotional support and encouragement given patient. R:  Denied SI and HI, contracts for safety.  Denied A/V hallucinations.  Denied pain.  Safety maintained with 15 minute checks.

## 2017-03-26 NOTE — [facility] Suicide Risk Assessment (Signed)
[facility] Admission Suicide Risk Assessment   Nursing information obtained from:  Patient Demographic factors:  Unemployed, Gay, lesbian, or bisexual orientation Current Mental Status:  Self-harm behaviors Loss Factors:  Loss of significant relationship, Financial problems / change in socioeconomic status Historical Factors:  Prior suicide attempts, Impulsivity Risk Reduction Factors:  Living with another person, especially a relative  Total Time spent with patient: 45 minutes Principal Problem: Bipolar I disorder, most recent episode depressed (HCC) Diagnosis:   Patient Active Problem List   Diagnosis Date Noted  . Bipolar I disorder, most recent episode depressed (HCC) [F31.30] 03/25/2017  . Acute respiratory failure (HCC) [J96.00] 03/22/2017  . Diphenhydramine overdose [T45.0X1A] 03/22/2017    Continued Clinical Symptoms:  Alcohol Use Disorder Identification Test Final Score (AUDIT): 2 The "Alcohol Use Disorders Identification Test", Guidelines for Use in Primary Care, Second Edition.  World Science writer [facility]). Score between 0-7:  no or low risk or alcohol related problems. Score between 8-15:  moderate risk of alcohol related problems. Score between 16-19:  high risk of alcohol related problems. Score 20 or above:  warrants further diagnostic evaluation for alcohol dependence and treatment.   CLINICAL FACTORS:  21 year old single female, no children, lives with her parents and with her siblings, currently unemployed, completing HS via online .  Patient was brought to ED on 10/21 following a seizure and being found unresponsive in the context of an overdose on Benadryl and NSAID. Patient states " I don't know I was trying to kill myself, but I did think , well if I die then I die".  States overdose was planned and acknowledges that prior to overdose she had been researching " ways to die ". States she has suffered from depression " for a long time", and states she has felt  depressed for several months, particularly after a break up which occurred a few months ago. Denies hallucinations. Endorses neuro-vegetative symptoms- reports anhedonia, poor appetite, poor energy level, and " crying a lot for no reason".  Attributes some of her depression to home related stressors, states that her three siblings all have autistic spectrum disorder, and that she needs to spend a lot of time monitoring them and insuring they do not get into her belongings or destroy them  Had one prior psychiatric admission last year. States " I was psychotic that time, I ran out of the house barefoot because I thought my dad was going to sell me into the black market". She states these events did occur in the context of feeling severely depressed as well.  She states she has attempted suicide 3-4 months ago by placing a bag over her head. Denies history of self cutting. Denies history of violence . Denies history of eating disorder . Does not endorse any clear history of mania/hypomania, but states that she does have significant mood swings within a day.  States she has never been on psychiatric medications in the past .   Denies alcohol abuse, states she smokes cannabis 1 x week. Admission BAL <5, and admission UDS negative  Denies medical illnesses NKDA   Dx- MDD, no psychotic features .   Plan- Inpatient treatment .   Start Zoloft 25 mgrs QDAY, start Zyprexa 2.5 mgrs QHS      Musculoskeletal: Strength & Muscle Tone: within normal limits Gait & Station: normal Patient leans: N/A  Psychiatric Specialty Exam: Physical Exam  ROS denies chest pain, no shortness of breath, no vomiting, no diarrhea, no rash   Blood  pressure 100/74, pulse (!) 109, temperature 98.6 F (37 C), temperature source Oral, resp. rate 16, height 5\' 5"  (1.651 m), weight 39.9 kg (88 lb), SpO2 100 %.Body mass index is 14.64 kg/m.  General Appearance: Fairly Groomed  Eye Contact:  Good  Speech:  Normal Rate   Volume:  Normal  Mood:  reports she is feeling better, and describes mood as 6/10  Affect:  Appropriate and slightly constricted  Thought Process:  Linear and Descriptions of Associations: Intact  Orientation:  Other:  fully alert and attentive  Thought Content:  denies hallucinations, no delusions expressed, does not appear internally preoccupied   Suicidal Thoughts:  No denies current suicidal or self injurious ideations, denies homicidal or violent ideations  Homicidal Thoughts:  No  Memory:  recent and remote grossly intact   Judgement:  Fair  Insight:  Fair  Psychomotor Activity:  recent and remote grossly intact   Concentration:  Concentration: Good and Attention Span: Good  Recall:  Good  Fund of Knowledge:  Good  Language:  Good  Akathisia:  Negative  Handed:  Right  AIMS (if indicated):     Assets:  Communication Skills Desire for Improvement Resilience  ADL's:  Intact  Cognition:  WNL  Sleep:  Number of Hours: 6.75      COGNITIVE FEATURES THAT CONTRIBUTE TO RISK:  Closed-mindedness and Loss of executive function    SUICIDE RISK:   Moderate:  Frequent suicidal ideation with limited intensity, and duration, some specificity in terms of plans, no associated intent, good self-control, limited dysphoria/symptomatology, some risk factors present, and identifiable protective factors, including available and accessible social support.  PLAN OF CARE: Patient will be admitted to inpatient psychiatric unit for stabilization and safety. Will provide and encourage milieu participation. Provide medication management and maked adjustments as needed.  Will follow daily.    I certify that inpatient services furnished can reasonably be expected to improve the patient's condition.   Craige CottaFernando A Caleyah Jr, MD 03/26/2017, 1:02 PM

## 2017-03-26 NOTE — [facility] Group Notes (Signed)
LCSW Group Therapy Note  03/26/2017 1:15pm  Type of Therapy/Topic:  Group Therapy:  Balance in Life  Participation Level:  Active  Description of Group:    This group will address the concept of balance and how it feels and looks when one is unbalanced. Patients will be encouraged to process areas in their lives that are out of balance and identify reasons for remaining unbalanced. Facilitators will guide patients in utilizing problem-solving interventions to address and correct the stressor making their life unbalanced. Understanding and applying boundaries will be explored and addressed for obtaining and maintaining a balanced life. Patients will be encouraged to explore ways to assertively make their unbalanced needs known to significant others in their lives, using other group members and facilitator for support and feedback.  Therapeutic Goals: 1. Patient will identify two or more emotions or situations they have that consume much of in their lives. 2. Patient will identify signs/triggers that life has become out of balance:  3. Patient will identify two ways to set boundaries in order to achieve balance in their lives:  4. Patient will demonstrate ability to communicate their needs through discussion and/or role plays  Summary of Patient Progress: Pt was present for the duration of the group. Pt states that her life is mostly unbalanced at the moment. Pt states that she does not feel very valued as a person and only sees value in herself when she is helping others. Pt reports that she would like to work on her self-worth.  Therapeutic Modalities:   Cognitive Behavioral Therapy Solution-Focused Therapy Assertiveness Training  Jonathon JordanLynn B Lonza Shimabukuro, MSW, LCSWA 03/26/2017 4:15 PM

## 2017-03-26 NOTE — Progress Notes (Signed)
NUTRITION ASSESSMENT  Pt identified as at risk on the Malnutrition Screen Tool  INTERVENTION: 1. Supplements: Ensure Enlive po TID, each supplement provides 350 kcal and 20 grams of protein  NUTRITION DIAGNOSIS: Unintentional weight loss related to sub-optimal intake as evidenced by pt report.   Goal: Pt to meet >/= 90% of their estimated nutrition needs.  Monitor:  PO intake  Assessment:  Pt admitted with depression and SA. Pt [facility] for overdosing on Benedryl. Pt was extubated 10/22. Pt reports good appetite but was only eating 10-45% of meals over the past 2 days. Will order supplement given poor PO and underweight status.   Height: Ht Readings from Last 1 Encounters:  03/25/17 5\' 5"  (1.651 m)    Weight: Wt Readings from Last 1 Encounters:  03/25/17 88 lb (39.9 kg)    Weight Hx: Wt Readings from Last 10 Encounters:  03/25/17 88 lb (39.9 kg)  03/24/17 99 lb 13.9 oz (45.3 kg)  05/22/16 86 lb 4 oz (39.1 kg) (<1 %, Z= -3.29)*  05/21/16 86 lb 4 oz (39.1 kg) (<1 %, Z= -3.29)*   * Growth percentiles are based on CDC 2-20 Years data.    BMI:  Body mass index is 14.64 kg/m. Pt meets criteria for underweight based on current BMI.  Estimated Nutritional Needs: Kcal: 25-30 kcal/kg Protein: > 1 gram protein/kg Fluid: 1 ml/kcal  Diet Order: Diet regular Room service appropriate? Yes; Fluid consistency: Thin Pt is also offered choice of unit snacks mid-morning and mid-afternoon.  Pt is eating as desired.   Lab results and medications reviewed.   Tilda FrancoLindsey Frans Valente, MS, RD, LDN [facility] Clinical Dietitian Pager: [phone] After Hours Pager: [phone]

## 2017-03-26 NOTE — Plan of Care (Signed)
Problem: Activity: Goal: Sleeping patterns will improve Outcome: Progressing Slept 6.75 hours last night according to flowsheet.    

## 2017-03-26 NOTE — Progress Notes (Signed)
Pt attend wrap up group. Her day was 7. Her goal was to get out of bed to socialized  and eat as much as possible her energy level been down.

## 2017-03-26 NOTE — Plan of Care (Signed)
Problem: Education: Goal: Utilization of techniques to improve thought processes will improve Outcome: Progressing Nurse discussed anxiety/depression/coping skills with patient.    

## 2017-03-26 NOTE — H&P (Signed)
Psychiatric Admission Assessment Adult  Patient Identification: Stacie Douglas MRN:  [id number] Date of Evaluation:  03/26/2017 Chief Complaint:  MDD Principal Diagnosis: Bipolar I disorder, most recent episode depressed ([place]) Diagnosis:   Patient Active Problem List   Diagnosis Date Noted  . Bipolar I disorder, most recent episode depressed ([place]) [F31.30] 03/25/2017  . Acute respiratory failure ([place]) [J96.00] 03/22/2017  . Diphenhydramine overdose [T45.0X1A] 03/22/2017   History of Present Illness:  03/23/17 Psych Consult: 21 years old female admitted to the hospital for intentional drug overdose as a suicide attempt and being found unresponsive in the floor reportedly patient has taken intentional overdose of sleeping pills (benadryl) unknown amount with intention to end her life. Patient reported she has been depressed on and off for the last one year and also extremely stressed about her home environment. Patient reported she has been home schooled secondary to multiple the locations in the past and reportedly she has it to younger siblings who has autism. Patient reported she has been smoking cigars once a week and also drinks alcohol more like a wind course. Patient stated her grades are okay. Patient has an disagreement with her boyfriend Rickey who is telling her not to go back with her other friends which is a disagreement between them.Patient urine drug screen is negative for drug of abuse and blood alcohol is not significant. Staff RN reported patient was intubated on arrival and she was extubated today. Patient stated she just woke up she could not talk to any family members. Patient has no family members in the hospital at the time of the evaluation. Based on my evaluation patient meets criteria for acute psychiatric hospitalization when medically stable.  03/25/17 [place] RN Assessment: She was admitted medically for OD on benadryl requiring intubation.  She reported that she was feeling  overwhelmed having to deal with autistic siblings, recent break up with controlling boyfriend and losing some of her friends because of him.  She reported history of one psychiatric hospitalization December 2017 due to issues with that same boyfriend.  During [facility] admission, she reported that she is still having some depression but is remorseful of the attempt.  She currently denies suicidal ideation and will contract for safety on the unit.  She denies HI or A/V hallucinations.  She denies any medical issues and appears to be in no physical distress.  Patient reports that she had planned this event for a while. [place] that she had researched it on the internet and asked her friends what was a painless death. She reports that it was a calm day at home and her parents weren't arguing so everyone was in their room and she had time to go get her mom's Benadryl Allergy and took the whole bottle and drank a wine cooler with it. She reports that then she woke up in the hospital. 1 year ago she reports having delusional thoughts and paranoia that her parents were going to kill her and sell her organs on the black market. She ran from her parents and that was when they sent her to [facility]. She reports that [facility] wanted to start her on Zyprexa but she refused.  She reports some minor paranoia from time to time, but nothing as extreme as 1 year ago.  She reports that she has used marijuana to help keep her calm, however, her UDS is negative. She rates her depression at 3/10 and anxiety at 2/10. She denies any current SI/HI/AVH or paranoia. Just to  note RN that admitted her last night reported that patient told a different story and denied any previous suicide attempts. Patient did mention that her parents fight a lot, there is concern if there is some personality disorder as well.  Associated Signs/Symptoms: Depression Symptoms:  depressed mood, insomnia, difficulty concentrating, recurrent thoughts of  death, suicidal thoughts with specific plan, anxiety, disturbed sleep, (Hypo) Manic Symptoms:  Delusions, Elevated Mood, Flight of Ideas, Impulsivity, Anxiety Symptoms:  Excessive Worry, Social Anxiety, Psychotic Symptoms:  Delusions, Paranoia, PTSD Symptoms: NA Total Time spent with patient: 45 minutes  Past Psychiatric History: [facility] admission 1 year ago for severe paranoia and delusional thoughts, no medications  Is the patient at risk to self? Yes.    Has the patient been a risk to self in the past 6 months? Yes.    Has the patient been a risk to self within the distant past? No.  Is the patient a risk to others? No.  Has the patient been a risk to others in the past 6 months? No.  Has the patient been a risk to others within the distant past? No.   Prior Inpatient Therapy:   Prior Outpatient Therapy:    Alcohol Screening: 1. How often do you have a drink containing alcohol?: 2 to 4 times a month 2. How many drinks containing alcohol do you have on a typical day when you are drinking?: 1 or 2 3. How often do you have six or more drinks on one occasion?: Never Preliminary Score: 0 9. Have you or someone else been injured as a result of your drinking?: No 10. Has a relative or friend or a doctor or another health worker been concerned about your drinking or suggested you cut down?: No Alcohol Use Disorder Identification Test Final Score (AUDIT): 2 Brief Intervention: AUDIT score less than 7 or less-screening does not suggest unhealthy drinking-brief intervention not indicated Substance Abuse History in the last 12 months:  Yes.   Consequences of Substance Abuse: Medical Consequences:  reviewed Family Consequences:  reviewed Previous Psychotropic Medications: No  Psychological Evaluations: yes - [place] 1 year ago Past Medical History:  Past Medical History:  Diagnosis Date  . GERD (gastroesophageal reflux disease)    History reviewed. No pertinent surgical  history. Family History:  Family History  Problem Relation Age of Onset  . Autism Sister   . Autism Brother    Family Psychiatric  History: 1 brother and 1 sister with Autism Tobacco Screening: Have you used any form of tobacco in the last 30 days? (Cigarettes, Smokeless Tobacco, Cigars, and/or Pipes): Yes Tobacco use, Select all that apply: cigar use, not daily Are you interested in Tobacco Cessation Medications?: No, patient refused Counseled patient on smoking cessation including recognizing danger situations, developing coping skills and basic information about quitting provided: Refused/Declined practical counseling Social History:  History  Alcohol Use No     History  Drug Use No    Additional Social History: Marital status: Single (Pt ended a relationship 4 mo ago that she describes as unhealthy ) Does patient have children?: No        Allergies:  No Known Allergies Lab Results:  Results for orders placed or performed during the hospital encounter of 03/22/17 (from the past 48 hour(s))  Glucose, capillary     Status: Abnormal   Collection Time: 03/24/17  3:18 PM  Result Value Ref Range   Glucose-Capillary 107 (H) 65 - 99 mg/dL   Comment 1 Capillary  Specimen    Comment 2 Notify RN   Basic metabolic panel     Status: Abnormal   Collection Time: 03/25/17  6:07 AM  Result Value Ref Range   Sodium 138 135 - 145 mmol/L   Potassium 3.8 3.5 - 5.1 mmol/L   Chloride 106 101 - 111 mmol/L   CO2 24 22 - 32 mmol/L   Glucose, Bld 87 65 - 99 mg/dL   BUN <5 (L) 6 - 20 mg/dL   Creatinine, Ser 0.47 0.44 - 1.00 mg/dL   Calcium 8.9 8.9 - 10.3 mg/dL   GFR calc non Af Amer >60 >60 mL/min   GFR calc Af Amer >60 >60 mL/min    Comment: (NOTE) The eGFR has been calculated using the CKD EPI equation. This calculation has not been validated in all clinical situations. eGFR's persistently <60 mL/min signify possible Chronic Kidney Disease.    Anion gap 8 5 - 15  Magnesium     Status:  Abnormal   Collection Time: 03/25/17  6:07 AM  Result Value Ref Range   Magnesium 1.6 (L) 1.7 - 2.4 mg/dL  TSH     Status: None   Collection Time: 03/25/17  6:07 AM  Result Value Ref Range   TSH 0.563 0.350 - 4.500 uIU/mL    Comment: Performed by a 3rd Generation assay with a functional sensitivity of <=0.01 uIU/mL.  Pregnancy, urine     Status: None   Collection Time: 03/25/17  2:00 PM  Result Value Ref Range   Preg Test, Ur NEGATIVE NEGATIVE    Comment:        THE SENSITIVITY OF THIS METHODOLOGY IS >20 mIU/mL.     Blood Alcohol level:  Lab Results  Component Value Date   ETH <10 03/22/2017   ETH <5 19/41/7408    Metabolic Disorder Labs:  No results found for: HGBA1C, MPG No results found for: PROLACTIN Lab Results  Component Value Date   TRIG 40 03/22/2017    Current Medications: Current Facility-Administered Medications  Medication Dose Route Frequency Provider Last Rate Last Dose  . alum & mag hydroxide-simeth (MAALOX/MYLANTA) 200-200-20 MG/5ML suspension 30 mL  30 mL Oral Q4H PRN Rankin, Shuvon B, NP      . feeding supplement (ENSURE ENLIVE) (ENSURE ENLIVE) liquid 237 mL  237 mL Oral TID BM Cobos, Myer Peer, MD   237 mL at 03/26/17 1041  . magnesium hydroxide (MILK OF MAGNESIA) suspension 30 mL  30 mL Oral Daily PRN Rankin, Shuvon B, NP      . menthol-cetylpyridinium (CEPACOL) lozenge 3 mg  1 lozenge Oral PRN Rankin, Shuvon B, NP      . polyethylene glycol (MIRALAX / GLYCOLAX) packet 17 g  17 g Oral Daily Rankin, Shuvon B, NP      . senna-docusate (Senokot-S) tablet 1 tablet  1 tablet Oral QHS PRN Rankin, Shuvon B, NP      . traZODone (DESYREL) tablet 50 mg  50 mg Oral QHS,MR X 1 Laverle Hobby, PA-C   50 mg at 03/25/17 2207   PTA Medications: Prescriptions Prior to Admission  Medication Sig Dispense Refill Last Dose  . ibuprofen (ADVIL,MOTRIN) 200 MG tablet Take 400 mg by mouth every 6 (six) hours as needed. Takes 40 mg after that she takes 200 mg every 4  hours   03/21/2017 at Unknown time  . OVER THE COUNTER MEDICATION Take 500 mg by mouth 4 (four) times daily. Elder Gwenlyn Found [address] [place] Her mother  combines the above ingredient to a power form and put it in a capsule of 500 mg and she takes it four times a day   03/21/2017 at Unknown time  . polyethylene glycol (MIRALAX / GLYCOLAX) packet Take 17 g by mouth daily. 14 each 0     Musculoskeletal: Strength & Muscle Tone: within normal limits Gait & Station: normal Patient leans: N/A  Psychiatric Specialty Exam: Physical Exam  Nursing note and vitals reviewed. Constitutional: She is oriented to person, place, and time. She appears well-developed and well-nourished.  Cardiovascular:  tachycardic  Respiratory: Effort normal.  Musculoskeletal: Normal range of motion.  Neurological: She is alert and oriented to person, place, and time.  Skin: Skin is warm.    Review of Systems  Constitutional: Negative.   HENT: Negative.   Eyes: Negative.   Respiratory: Negative.   Cardiovascular: Negative.   Gastrointestinal: Negative.   Genitourinary: Negative.   Musculoskeletal: Negative.   Skin: Negative.   Neurological: Negative.   Endo/Heme/Allergies: Negative.   Psychiatric/Behavioral: Positive for depression and suicidal ideas. Negative for hallucinations. The patient is nervous/anxious and has insomnia.     Blood pressure 100/74, pulse (!) 109, temperature 98.6 F (37 C), temperature source Oral, resp. rate 16, height 5' 5"  (1.651 m), weight 39.9 kg (88 lb), SpO2 100 %.Body mass index is 14.64 kg/m.  General Appearance: Casual  Eye Contact:  Good  Speech:  Clear and Coherent and Normal Rate  Volume:  Normal  Mood:  Anxious and Depressed  Affect:  Congruent  Thought Process:  Goal Directed and Descriptions of Associations: Intact  Orientation:  Full (Time, Place, and Person)  Thought Content:  WDL  Suicidal Thoughts:  No  Homicidal Thoughts:  No  Memory:   Immediate;   Good Recent;   Good Remote;   Good  Judgement:  Fair  Insight:  Fair  Psychomotor Activity:  Normal  Concentration:  Concentration: Good and Attention Span: Good  Recall:  Good  Fund of Knowledge:  Good  Language:  Good  Akathisia:  No  Handed:  Right  AIMS (if indicated):     Assets:  Communication Skills Desire for Improvement Financial Resources/Insurance Housing Physical Health Social Support Transportation  ADL's:  Intact  Cognition:  WNL  Sleep:  Number of Hours: 6.75    Treatment Plan Summary: Daily contact with patient to assess and evaluate symptoms and progress in treatment, Medication management and Plan is to:  See MAR and SRA for medication management -Encourage group therapy participation  Observation Level/Precautions:  15 minute checks  Laboratory:  Reviewed  Psychotherapy:    Medications:    Consultations:    Discharge Concerns:    Estimated LOS:  Other:     Physician Treatment Plan for Primary Diagnosis: Bipolar I disorder, most recent episode depressed ([place]) Long Term Goal(s): Improvement in symptoms so as ready for discharge  Short Term Goals: Ability to verbalize feelings will improve and Ability to identify and develop effective coping behaviors will improve  Physician Treatment Plan for Secondary Diagnosis: Principal Problem:   Bipolar I disorder, most recent episode depressed ([place])  Long Term Goal(s): Improvement in symptoms so as ready for discharge  Short Term Goals: Ability to verbalize feelings will improve, Ability to disclose and discuss suicidal ideas and Ability to demonstrate self-control will improve  I certify that inpatient services furnished can reasonably be expected to improve the patient's condition.    Lewis Shock, FNP 10/25/201812:48 PM   I have discussed  case with NP and have met with patient  Agree with NP note and assessment  21 year old single female, no children, lives with her parents and with  her siblings, currently unemployed, completing HS via online .  Patient was brought to ED on 10/21 following a seizure and being found unresponsive in the context of an overdose on Benadryl and NSAID. Patient states " I don't know I was trying to kill myself, but I did think , well if I die then I die".  States overdose was planned and acknowledges that prior to overdose she had been researching " ways to die ". States she has suffered from depression " for a long time", and states she has felt depressed for several months, particularly after a break up which occurred a few months ago. Denies hallucinations. Endorses neuro-vegetative symptoms- reports anhedonia, poor appetite, poor energy level, and " crying a lot for no reason".  Attributes some of her depression to home related stressors, states that her three siblings all have autistic spectrum disorder, and that she needs to spend a lot of time monitoring them and insuring they do not get into her belongings or destroy them  Had one prior psychiatric admission last year. States " I was psychotic that time, I ran out of the house barefoot because I thought my dad was going to sell me into the black market". She states these events did occur in the context of feeling severely depressed as well.  She states she has attempted suicide 3-4 months ago by placing a bag over her head. Denies history of self cutting. Denies history of violence . Denies history of eating disorder . Does not endorse any clear history of mania/hypomania, but states that she does have significant mood swings within a day.  States she has never been on psychiatric medications in the past .   Denies alcohol abuse, states she smokes cannabis 1 x week. Admission BAL <5, and admission UDS negative  Denies medical illnesses NKDA   Dx- MDD, no psychotic features .   Plan- Inpatient treatment .   Start Zoloft 25 mgrs QDAY, start Zyprexa 2.5 mgrs QHS

## 2017-03-27 DIAGNOSIS — F39 Unspecified mood [affective] disorder: Secondary | ICD-10-CM

## 2017-03-27 DIAGNOSIS — T450X1A Poisoning by antiallergic and antiemetic drugs, accidental (unintentional), initial encounter: Secondary | ICD-10-CM

## 2017-03-27 DIAGNOSIS — Z810 Family history of intellectual disabilities: Secondary | ICD-10-CM

## 2017-03-27 DIAGNOSIS — T1491XA Suicide attempt, initial encounter: Secondary | ICD-10-CM

## 2017-03-27 DIAGNOSIS — J9600 Acute respiratory failure, unspecified whether with hypoxia or hypercapnia: Secondary | ICD-10-CM

## 2017-03-27 DIAGNOSIS — G4700 Insomnia, unspecified: Secondary | ICD-10-CM

## 2017-03-27 LAB — COMPREHENSIVE METABOLIC PANEL
ALBUMIN: 4 g/dL (ref 3.5–5.0)
ALT: 29 U/L (ref 14–54)
ANION GAP: 10 (ref 5–15)
AST: 41 U/L (ref 15–41)
Alkaline Phosphatase: 44 U/L (ref 38–126)
BUN: 8 mg/dL (ref 6–20)
CO2: 28 mmol/L (ref 22–32)
Calcium: 9.4 mg/dL (ref 8.9–10.3)
Chloride: 98 mmol/L — ABNORMAL LOW (ref 101–111)
Creatinine, Ser: 0.64 mg/dL (ref 0.44–1.00)
GFR calc Af Amer: >60 mL/min (ref >60)
GFR calc non Af Amer: >60 mL/min (ref >60)
GLUCOSE: 85 mg/dL (ref 65–99)
POTASSIUM: 3.9 mmol/L (ref 3.5–5.1)
SODIUM: 136 mmol/L (ref 135–145)
Total Bilirubin: 0.6 mg/dL (ref 0.3–1.2)
Total Protein: 7.5 g/dL (ref 6.5–8.1)

## 2017-03-27 NOTE — Plan of Care (Signed)
Problem: Activity: Goal: Interest or engagement in activities will improve Outcome: Progressing Pt participated in wrap-up group this evening.    

## 2017-03-27 NOTE — Progress Notes (Signed)
[facility] MD Progress Note  03/27/2017 2:00 PM Stacie Douglas  MRN:  [id number]   Subjective:  Patient reports that she slept well. She was a little dizzy when she first got up this morning but it went away and she has been feeling good. She denies any HI/AVH, but she reports minor SI upon waking this morning but none currently.  Objective: Patient's chart and findings reviewed and discussed with treatment team. Patient is pleasant and cooperative. She is lying in bed with her head covered, but replies appropriately and uncovers her head when spoken to. Patient will continue current medications and is encouraged to increase fluid intake.  Principal Problem: Bipolar I disorder, most recent episode depressed ([place]) Diagnosis:   Patient Active Problem List   Diagnosis Date Noted  . Bipolar I disorder, most recent episode depressed ([place]) [F31.30] 03/25/2017  . Acute respiratory failure ([place]) [J96.00] 03/22/2017  . Diphenhydramine overdose [T45.0X1A] 03/22/2017   Total Time spent with patient: 15 minutes  Past Psychiatric History: See H&P  Past Medical History:  Past Medical History:  Diagnosis Date  . GERD (gastroesophageal reflux disease)    History reviewed. No pertinent surgical history. Family History:  Family History  Problem Relation Age of Onset  . Autism Sister   . Autism Brother    Family Psychiatric  History: See H&P Social History:  History  Alcohol Use No     History  Drug Use No    Social History   Social History  . Marital status: Single    Spouse name: N/A  . Number of children: N/A  . Years of education: N/A   Social History Main Topics  . Smoking status: Never Smoker  . Smokeless tobacco: Never Used  . Alcohol use No  . Drug use: No  . Sexual activity: No   Other Topics Concern  . None   Social History Narrative  . None   Additional Social History:                         Sleep: Good  Appetite:  Good  Current Medications: Current  Facility-Administered Medications  Medication Dose Route Frequency Provider Last Rate Last Dose  . alum & mag hydroxide-simeth (MAALOX/MYLANTA) 200-200-20 MG/5ML suspension 30 mL  30 mL Oral Q4H PRN Rankin, Shuvon B, NP      . feeding supplement (ENSURE ENLIVE) (ENSURE ENLIVE) liquid 237 mL  237 mL Oral TID BM Alesa Echevarria, Myer Peer, MD   237 mL at 03/26/17 2105  . magnesium hydroxide (MILK OF MAGNESIA) suspension 30 mL  30 mL Oral Daily PRN Rankin, Shuvon B, NP      . menthol-cetylpyridinium (CEPACOL) lozenge 3 mg  1 lozenge Oral PRN Rankin, Shuvon B, NP      . OLANZapine (ZYPREXA) tablet 2.5 mg  2.5 mg Oral QHS Wanna Gully, Myer Peer, MD   2.5 mg at 03/26/17 2106  . polyethylene glycol (MIRALAX / GLYCOLAX) packet 17 g  17 g Oral Daily Rankin, Shuvon B, NP      . senna-docusate (Senokot-S) tablet 1 tablet  1 tablet Oral QHS PRN Rankin, Shuvon B, NP      . sertraline (ZOLOFT) tablet 25 mg  25 mg Oral Daily Shealyn Sean, Myer Peer, MD   25 mg at 03/26/17 1427  . traZODone (DESYREL) tablet 50 mg  50 mg Oral QHS PRN Denay Pleitez, Myer Peer, MD   50 mg at 03/26/17 2105    Lab Results:  Results for orders  placed or performed during the hospital encounter of 03/25/17 (from the past 48 hour(s))  Comprehensive metabolic panel     Status: Abnormal   Collection Time: 03/27/17  6:28 AM  Result Value Ref Range   Sodium 136 135 - 145 mmol/L   Potassium 3.9 3.5 - 5.1 mmol/L   Chloride 98 (L) 101 - 111 mmol/L   CO2 28 22 - 32 mmol/L   Glucose, Bld 85 65 - 99 mg/dL   BUN 8 6 - 20 mg/dL   Creatinine, Ser 0.64 0.44 - 1.00 mg/dL   Calcium 9.4 8.9 - 10.3 mg/dL   Total Protein 7.5 6.5 - 8.1 g/dL   Albumin 4.0 3.5 - 5.0 g/dL   AST 41 15 - 41 U/L   ALT 29 14 - 54 U/L   Alkaline Phosphatase 44 38 - 126 U/L   Total Bilirubin 0.6 0.3 - 1.2 mg/dL   GFR calc non Af Amer >60 >60 mL/min   GFR calc Af Amer >60 >60 mL/min    Comment: (NOTE) The eGFR has been calculated using the CKD EPI equation. This calculation has not been  validated in all clinical situations. eGFR's persistently <60 mL/min signify possible Chronic Kidney Disease.    Anion gap 10 5 - 15    Comment: Performed at [facility], [place] [address], [place], [place] [address]    Blood Alcohol level:  Lab Results  Component Value Date   ETH <10 03/22/2017   ETH <5 97/94/8016    Metabolic Disorder Labs: No results found for: HGBA1C, MPG No results found for: PROLACTIN Lab Results  Component Value Date   TRIG 40 03/22/2017    Physical Findings: AIMS: Facial and Oral Movements Muscles of Facial Expression: None, normal Lips and Perioral Area: None, normal Jaw: None, normal Tongue: None, normal,Extremity Movements Upper (arms, wrists, hands, fingers): None, normal Lower (legs, knees, ankles, toes): None, normal, Trunk Movements Neck, shoulders, hips: None, normal, Overall Severity Severity of abnormal movements (highest score from questions above): None, normal Incapacitation due to abnormal movements: None, normal Patient's awareness of abnormal movements (rate only patient's report): No Awareness, Dental Status Current problems with teeth and/or dentures?: No Does patient usually wear dentures?: No  CIWA:  CIWA-Ar Total: 1 COWS:  COWS Total Score: 2  Musculoskeletal: Strength & Muscle Tone: within normal limits Gait & Station: normal Patient leans: N/A  Psychiatric Specialty Exam: Physical Exam  Nursing note and vitals reviewed. Constitutional: She is oriented to person, place, and time. She appears well-developed and well-nourished.  Cardiovascular: Normal rate.   Respiratory: Effort normal.  Musculoskeletal: Normal range of motion.  Neurological: She is alert and oriented to person, place, and time.  Skin: Skin is warm.    Review of Systems  Constitutional: Negative.   HENT: Negative.   Respiratory: Negative.   Cardiovascular: Negative.   Gastrointestinal: Negative.   Genitourinary: Negative.    Musculoskeletal: Negative.   Skin: Negative.   Neurological: Negative.   Endo/Heme/Allergies: Negative.   Psychiatric/Behavioral: Positive for depression. Negative for hallucinations and suicidal ideas. The patient is not nervous/anxious.     Blood pressure 93/65, pulse 93, temperature 99.3 F (37.4 C), temperature source Oral, resp. rate 16, height 5' 5"  (1.651 m), weight 39.9 kg (88 lb), SpO2 100 %.Body mass index is 14.64 kg/m.  General Appearance: Disheveled  Eye Contact:  Good  Speech:  Clear and Coherent and Normal Rate  Volume:  Normal  Mood:  Depressed  Affect:  Congruent  Thought Process:  Goal Directed and Descriptions of Associations: Intact  Orientation:  Full (Time, Place, and Person)  Thought Content:  WDL  Suicidal Thoughts:  No  Homicidal Thoughts:  No  Memory:  Immediate;   Good Recent;   Good Remote;   Good  Judgement:  Fair  Insight:  Good  Psychomotor Activity:  Normal  Concentration:  Concentration: Good and Attention Span: Good  Recall:  Good  Fund of Knowledge:  Good  Language:  Good  Akathisia:  No  Handed:  Right  AIMS (if indicated):     Assets:  Communication Skills Desire for Improvement Financial Resources/Insurance Housing Physical Health Social Support Transportation  ADL's:  Intact  Cognition:  WNL  Sleep:  Number of Hours: 6.75   Problems Addressed: Bipolar I  Treatment Plan Summary: Daily contact with patient to assess and evaluate symptoms and progress in treatment, Medication management and Plan is to:  -Continue Zyprexa 2.5 mg PO QHS for mood stability -Continue Zoloft 25 mg PO Daily for mood stability -Continue Trazodone 50 mg PO QHS PRN for insomnia -Encourage group therapy participation  Lewis Shock, FNP 03/27/2017, 2:00 PM   Agree with NP Progress Note

## 2017-03-27 NOTE — Progress Notes (Signed)
Recreation Therapy Notes  Date: 03/27/17 Time: 0930 Location: 300 Hall Dayroom  Group Topic: Stress Management  Goal Area(s) Addresses:  Patient will verbalize importance of using healthy stress management.  Patient will identify positive emotions associated with healthy stress management.   Intervention: Stress Management  Activity :  LRT introduced the stress management technique of meditation.  LRT played a meditation from the Calm app that allowed patients to examine the process of humanity and how we interact with each other.  Patients were to follow along as the meditation played to fully engage in the technique.  Education:  Stress Management, Discharge Planning.   Education Outcome: Acknowledges edcuation/In group clarification offered/Needs additional education  Clinical Observations/Feedback: Pt did not attend group.   Anysa Tacey, LRT/CTRS         Lopez Dentinger A 03/27/2017 12:59 PM 

## 2017-03-27 NOTE — Progress Notes (Signed)
  DATA ACTION RESPONSE  Objective- Pt. is visible in the dayroom, seen interacting with peers and playing cards. Presents with a blunted/anxious affect and mood.Pt brightens mildly on approach. Pt presents with limited insight and avoids eye contact upon interaction. Pt endorses mild drowsiness with medications.  Subjective- Denies having any SI/HI/AVH/Pain at this time.Is cooperative and remains safe on the unit.  1:1 interaction in private to establish rapport. Encouragement, education, & support given from staff.    Safety maintained with Q 15 checks. Continue with POC.

## 2017-03-27 NOTE — Progress Notes (Signed)
D Patient is seen OOB UAL on the 400 hall today ...she tolerates this fairly well. She  Avoided making eye contact and / or speaking with this patient today..She slept in her bed off and on  Most  of the day.A She did complete her daily assessment and on this she wrote she she has experienced SI today ( but she is willing to contract for safeyt with this Clinical research associatewriter). She rates her depression, hopelessness and anxiety " 3/5/5/", respectively, R Safety is in place.

## 2017-03-27 NOTE — [facility] Group Notes (Signed)
LCSW Group Therapy Note  03/27/2017 1:15pm  Type of Therapy and Topic:  Group Therapy:  Feelings around Relapse and Recovery  Participation Level: Pt invited. Did not attend.   Stacie JordanLynn B Davius Douglas, MSW, LCSWA 03/27/2017 3:57 PM

## 2017-03-27 NOTE — Tx Team (Signed)
Interdisciplinary Treatment and Diagnostic Plan Update 03/27/2017 Time of Session: 9:30am  Stacie FolkKaren Douglas  MRN: [id number]  Principal Diagnosis: Bipolar I disorder, most recent episode depressed (HCC)  Secondary Diagnoses: Principal Problem:   Bipolar I disorder, most recent episode depressed (HCC) Active Problems:   Diphenhydramine overdose   Current Medications:  Current Facility-Administered Medications  Medication Dose Route Frequency Provider Last Rate Last Dose  . alum & mag hydroxide-simeth (MAALOX/MYLANTA) 200-200-20 MG/5ML suspension 30 mL  30 mL Oral Q4H PRN Rankin, Shuvon B, NP      . feeding supplement (ENSURE ENLIVE) (ENSURE ENLIVE) liquid 237 mL  237 mL Oral TID BM Cobos, Rockey SituFernando A, MD   237 mL at 03/26/17 2105  . magnesium hydroxide (MILK OF MAGNESIA) suspension 30 mL  30 mL Oral Daily PRN Rankin, Shuvon B, NP      . menthol-cetylpyridinium (CEPACOL) lozenge 3 mg  1 lozenge Oral PRN Rankin, Shuvon B, NP      . OLANZapine (ZYPREXA) tablet 2.5 mg  2.5 mg Oral QHS Cobos, Rockey SituFernando A, MD   2.5 mg at 03/26/17 2106  . polyethylene glycol (MIRALAX / GLYCOLAX) packet 17 g  17 g Oral Daily Rankin, Shuvon B, NP      . senna-docusate (Senokot-S) tablet 1 tablet  1 tablet Oral QHS PRN Rankin, Shuvon B, NP      . sertraline (ZOLOFT) tablet 25 mg  25 mg Oral Daily Cobos, Rockey SituFernando A, MD   25 mg at 03/26/17 1427  . traZODone (DESYREL) tablet 50 mg  50 mg Oral QHS PRN Cobos, Rockey SituFernando A, MD   50 mg at 03/26/17 2105    PTA Medications: Prescriptions Prior to Admission  Medication Sig Dispense Refill Last Dose  . ibuprofen (ADVIL,MOTRIN) 200 MG tablet Take 400 mg by mouth every 6 (six) hours as needed. Takes 40 mg after that she takes 200 mg every 4 hours   03/21/2017 at Unknown time  . OVER THE COUNTER MEDICATION Take 500 mg by mouth 4 (four) times daily. Elder Allyson SabalBerry Sever Sew Burdock Blue Vervain Her mother combines the above ingredient to a power form and put it in a capsule of 500 mg  and she takes it four times a day   03/21/2017 at Unknown time  . polyethylene glycol (MIRALAX / GLYCOLAX) packet Take 17 g by mouth daily. 14 each 0     Treatment Modalities: Medication Management, Group therapy, Case management,  1 to 1 session with clinician, Psychoeducation, Recreational therapy.  Patient Stressors: Financial difficulties Other: Helping take care of autistic siblings Patient Strengths: Wellsite geologistCommunication skills General fund of knowledge Physical Health Supportive family/friends Work Firefighterskills  Physician Treatment Plan for Primary Diagnosis: Bipolar I disorder, most recent episode depressed (HCC) Long Term Goal(s): Improvement in symptoms so as ready for discharge Short Term Goals: Ability to verbalize feelings will improve Ability to identify and develop effective coping behaviors will improve Ability to verbalize feelings will improve Ability to disclose and discuss suicidal ideas Ability to demonstrate self-control will improve  Medication Management: Evaluate patient's response, side effects, and tolerance of medication regimen.  Therapeutic Interventions: 1 to 1 sessions, Unit Group sessions and Medication administration.  Evaluation of Outcomes: Progressing  Physician Treatment Plan for Secondary Diagnosis: Principal Problem:   Bipolar I disorder, most recent episode depressed (HCC) Active Problems:   Diphenhydramine overdose  Long Term Goal(s): Improvement in symptoms so as ready for discharge  Short Term Goals: Ability to verbalize feelings will improve Ability to identify and develop effective  coping behaviors will improve Ability to verbalize feelings will improve Ability to disclose and discuss suicidal ideas Ability to demonstrate self-control will improve  Medication Management: Evaluate patient's response, side effects, and tolerance of medication regimen.  Therapeutic Interventions: 1 to 1 sessions, Unit Group sessions and Medication  administration.  Evaluation of Outcomes: Progressing  RN Treatment Plan for Primary Diagnosis: Bipolar I disorder, most recent episode depressed (HCC) Long Term Goal(s): Knowledge of disease and therapeutic regimen to maintain health will improve  Short Term Goals: Ability to identify and develop effective coping behaviors will improve and Compliance with prescribed medications will improve  Medication Management: RN will administer medications as ordered by provider, will assess and evaluate patient's response and provide education to patient for prescribed medication. RN will report any adverse and/or side effects to prescribing provider.  Therapeutic Interventions: 1 on 1 counseling sessions, Psychoeducation, Medication administration, Evaluate responses to treatment, Monitor vital signs and CBGs as ordered, Perform/monitor CIWA, COWS, AIMS and Fall Risk screenings as ordered, Perform wound care treatments as ordered.  Evaluation of Outcomes: Progressing  LCSW Treatment Plan for Primary Diagnosis: Bipolar I disorder, most recent episode depressed (HCC) Long Term Goal(s): Safe transition to appropriate next level of care at discharge, Engage patient in therapeutic group addressing interpersonal concerns. Short Term Goals: Engage patient in aftercare planning with referrals and resources, Increase ability to appropriately verbalize feelings, Increase emotional regulation, Identify triggers associated with mental health/substance abuse issues and Increase skills for wellness and recovery  Therapeutic Interventions: Assess for all discharge needs, 1 to 1 time with Social worker, Explore available resources and support systems, Assess for adequacy in community support network, Educate family and significant other(s) on suicide prevention, Complete Psychosocial Assessment, Interpersonal group therapy.  Evaluation of Outcomes: Progressing  Progress in Treatment: Attending groups:  Yes Participating in groups: Yes Taking medication as prescribed: Yes, MD continues to assess for medication changes as needed Toleration medication: Yes, no side effects reported at this time Family/Significant other contact made: No, CSW attempting contact with pt's parents. Patient understands diagnosis: Developing insight  Discussing patient identified problems/goals with staff: Yes Medical problems stabilized or resolved: Yes Denies suicidal/homicidal ideation: No, pt recently admitted with SI after a suicide attempt Issues/concerns per patient self-inventory: None Other: N/A  New problem(s) identified: None identified at this time.   New Short Term/Long Term Goal(s): None identified at this time.   Discharge Plan or Barriers: Pt will return home and follow up outpatient with [facility].  Reason for Continuation of Hospitalization:  Anxiety  Depression Medication stabilization Suicidal ideation  Estimated Length of Stay: 1-3 days; Estimated discharge date 03/30/17  Attendees: Patient: 03/27/2017 10:13 AM  Physician: Dr. Jama Flavors 03/27/2017 10:13 AM  Nursing: Erskine Squibb RN; [place], RN 03/27/2017 10:13 AM  RN Care Manager:  03/27/2017 10:13 AM  Social Worker: Donnelly Stager, LCSWA 03/27/2017 10:13 AM  Recreational Therapist:  03/27/2017 10:13 AM  Other: Armandina Stammer, NP 03/27/2017 10:13 AM  Other:  03/27/2017 10:13 AM  Other: 03/27/2017 10:13 AM  Scribe for Treatment Team: Jonathon Jordan, MSW,LCSWA 03/27/2017 10:13 AM

## 2017-03-28 DIAGNOSIS — J9601 Acute respiratory failure with hypoxia: Secondary | ICD-10-CM

## 2017-03-28 MED ORDER — SERTRALINE HCL 50 MG PO TABS
50.0000 mg | ORAL_TABLET | Freq: Every day | ORAL | Status: DC
  Administered 2017-03-29 – 2017-04-01 (×4): 50 mg via ORAL
  Filled 2017-03-28 (×5): qty 1

## 2017-03-28 MED ORDER — ACETAMINOPHEN 325 MG PO TABS
ORAL_TABLET | ORAL | Status: AC
  Filled 2017-03-28: qty 1

## 2017-03-28 MED ORDER — ACETAMINOPHEN 325 MG PO TABS
650.0000 mg | ORAL_TABLET | Freq: Four times a day (QID) | ORAL | Status: DC | PRN
  Administered 2017-03-30 – 2017-03-31 (×3): 650 mg via ORAL
  Filled 2017-03-28 (×3): qty 2

## 2017-03-28 NOTE — Progress Notes (Signed)
[facility] Group Notes:  (Nursing/MHT/Case Management/Adjunct)  Date:  03/28/2017  Time:  9:10 PM  Type of Therapy:  Psychoeducational Skills  Participation Level:  Active  Participation Quality:  Attentive  Affect:  Appropriate  Cognitive:  Appropriate  Insight:  Improving  Engagement in Group:  Developing/Improving  Modes of Intervention:  Education  Summary of Progress/Problems: Patient verbalized that she had some bad dreams last evening but she eventually decided to go to meals and socialize more with her peers. In terms of the theme for the day, her coping skill will be to hug her peers and offer them support.   Andray Assefa S 03/28/2017, 9:10 PM

## 2017-03-28 NOTE — Discharge Summary (Signed)
[facility] Discharge Summary   Patient: Stacie Douglas NWG:[id number]   PCP: [facility] DOB: 02/06/1996   Date of admission: 03/22/2017   Date of discharge: 03/25/2017    Discharge Diagnoses:  Active Problems:   Acute respiratory failure (HCC)   Diphenhydramine overdose   Admitted From: home Disposition:  [facility]  Recommendations for Outpatient Follow-up:  1. Follow-up with PCP in 1-2 weeks.  Follow-up Information    [facility], [facility]. Schedule an appointment as soon as possible for a visit in 2 week(s).   Contact information: [address] [place] [address] [phone]          Diet recommendation: Regular diet  Activity: The patient is advised to gradually reintroduce usual activities.  Discharge Condition: good  Code Status: Full code  History of present illness: As per the H and P dictated on admission, "21yoF with hx GERD, presents today as a transfer from [facility] where she presented this morning after being found down in her house. Her parents say they last saw her normal on 10/21 at 1:30am. At approx 5:30am they found her on the floor unresponsive next to an empty bottle of benadryl. EMS was called and reportedly she had a seizure with EMS as well as another seizure at the outside hospital ER. When she first presented to them she had tachycardia in 170's with wide QRS and prolonged QT. She was given Magnesium and Bicarb with improvement of both. She was intubated and transferred to [facility]. At the time of my exam, her family has not yet arrived at the hospital. Per the outside hospital report, the family reports they think she took 70 tabs of benadryl; in addition an ibuprofen bottle was missing"  Hospital Course:  Summary of her active problems in the hospital is as following.  Intentional drug OD - Benadryl and presumed ibuprofen Aspirin APAP alcohol and UDS were negative - CT head negative - clinically  stabilized at this time   Prolonged QTC - SVT & Sinus Tachycardia Initial QTC was 600 - treated with magnesium and bicarbonate - prolonged QTc resolved on f/u EKG - sinus tachycardia persists   Acute encephalopathy in the setting of drug overdose Resolved w/ clearance of offending agent from system  Reported seizure no further episodes noted - monitor - no further testing indicated unless sx recur   Acute respiratory failure in the setting of altered mental status following drug overdose rapidly extubated - no persisting respiratory issues - some sore throat to be expected post intubation/extubation   Evolving metabolic acidosis, non-anion gap due to progressive hyperchloremia with saline resuscitation - resolved w/ correction   Hypokalemia  Supplement and follow  Hypoglycemia Resolved w/ resumption of diet   Mild anemia suspect hemo-dilution- no evidence of bleeding  Low-grade fever and leukocytosis Resolving - no evidence of active infection    All other chronic medical condition were stable during the hospitalization.  Patient was ambulatory without any assistance. On the day of the discharge the patient's vitals were stable, and no other acute medical condition were reported by patient. the patient was felt safe to be discharge at [facility] with psych.  Procedures and Results:  ETT   Consultations:  PCCM priamry admit  psych  DISCHARGE MEDICATION: Discharge Medication List as of 03/25/2017  2:49 PM    START taking these medications   Details  polyethylene glycol (MIRALAX / GLYCOLAX) packet Take 17 g by mouth daily., Starting Thu 03/26/2017, Normal  CONTINUE these medications which have NOT CHANGED   Details  ibuprofen (ADVIL,MOTRIN) 200 MG tablet Take 400 mg by mouth every 6 (six) hours as needed. Takes 40 mg after that she takes 200 mg every 4 hours, Historical Med    OVER THE COUNTER MEDICATION Take 500 mg by mouth 4 (four) times daily. Elder  Allyson Sabal Sever Sew Burdock Blue Vervain Her mother combines the above ingredient to a power form and put it in a capsule of 500 mg and she takes it four times a day, Historical Med       No Known Allergies Discharge Instructions    Diet general    Complete by:  As directed    Increase activity slowly    Complete by:  As directed      Discharge Exam: Filed Weights   03/23/17 0400 03/23/17 1400 03/24/17 0400  Weight: 46.2 kg (101 lb 13.6 oz) 46.4 kg (102 lb 4.8 oz) 45.3 kg (99 lb 13.9 oz)   Vitals:   03/24/17 1950 03/25/17 0602  BP: 110/62 101/68  Pulse: (!) 114 (!) 113  Resp: 16 16  Temp: 98.4 F (36.9 C) 98.1 F (36.7 C)  SpO2: 100% 100%   General: Appear in no distress, no Rash; Oral Mucosa moist. Cardiovascular: S1 and S2 Present, no Murmur, no JVD Respiratory: Bilateral Air entry present and Clear to Auscultation, no Crackles, no wheezes Abdomen: Bowel Sound present, Soft and no tenderness Extremities: no Pedal edema, no calf tenderness Neurology: Grossly no focal neuro deficit.  The results of significant diagnostics from this hospitalization (including imaging, microbiology, ancillary and laboratory) are listed below for reference.    Significant Diagnostic Studies: Ct Head Wo Contrast  Result Date: 03/22/2017 CLINICAL DATA:  Patient unresponsive to painful stimuli. Found unconscious earlier today. EXAM: CT HEAD WITHOUT CONTRAST TECHNIQUE: Contiguous axial images were obtained from the base of the skull through the vertex without intravenous contrast. COMPARISON:  None. FINDINGS: Brain: No evidence for acute infarction, hemorrhage, mass lesion, hydrocephalus, or extra-axial fluid. Normal cerebral volume. No white matter disease. 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 Vascular: No hyperdense vessel or unexpected calcification. Skull: Normal. Negative for fracture or focal lesion. Bony remodeling in the region of the posterior  fossa CSF collection indicates chronicity. Sinuses/Orbits: No acute finding. Other: None. IMPRESSION: Negative exam. No cause is seen for the reported altered level of consciousness. Electronically Signed   By: Elsie Stain M.D.   On: 03/22/2017 07:19   Dg Chest Port 1 View  Result Date: 03/23/2017 CLINICAL DATA:  Respiratory failure EXAM: PORTABLE CHEST 1 VIEW COMPARISON:  03/22/2017 FINDINGS: Cardiac shadow is within normal limits. Endotracheal tube and nasogastric catheter are noted in satisfactory position stable from the prior exam. The lungs are well aerated bilaterally. No focal infiltrate or sizable effusion is noted. IMPRESSION: No active disease. Electronically Signed   By: Alcide Clever M.D.   On: 03/23/2017 07:37   Dg Chest Port 1 View  Result Date: 03/22/2017 CLINICAL DATA:  Pt brought to ed unresponsive, per ems had 15 sec seizure, per mother she found a whole bottle of benadryl empty. EXAM: PORTABLE CHEST 1 VIEW COMPARISON:  None. FINDINGS: Endotracheal tube with the tip 2.2 cm above the carina. Nasogastric tube coursing below the diaphragm. No focal consolidation. No pleural effusion or pneumothorax. Stable cardiomediastinal silhouette. No acute osseous abnormality. IMPRESSION: 1. Endotracheal tube with the tip 2.2 cm above the carina. 2. No active cardiopulmonary disease. Electronically Signed  By: Elige KoHetal  Tevon Berhane   On: 03/22/2017 10:32   Dg Chest Portable 1 View  Result Date: 03/22/2017 CLINICAL DATA:  21 y/o  F; seizure and altered mental status. EXAM: PORTABLE CHEST 1 VIEW COMPARISON:  None. FINDINGS: Normal cardiac silhouette. Clear lungs. Bones are unremarkable. Endotracheal tube is 2.4 cm carina. Enteric tube tip projects over the mid esophagus in thorax. IMPRESSION: 1. No acute cardiopulmonary process identified. 2. Endotracheal tube 2.4 cm from carina. 3. Enteric tube tip in mid esophagus, advancement recommended. These results were called by telephone at the time of  interpretation on 03/22/2017 at 6:32 am to Dr. Glynn OctaveSTEPHEN RANCOUR , who verbally acknowledged these results. Electronically Signed   By: Mitzi HansenLance  Furusawa-Stratton M.D.   On: 03/22/2017 06:33    Microbiology: Recent Results (from the past 240 hour(s))  MRSA PCR Screening     Status: None   Collection Time: 03/22/17 11:49 AM  Result Value Ref Range Status   MRSA by PCR NEGATIVE NEGATIVE Final    Comment:        The GeneXpert MRSA Assay (FDA approved for NASAL specimens only), is one component of a comprehensive MRSA colonization surveillance program. It is not intended to diagnose MRSA infection nor to guide or monitor treatment for MRSA infections.      Labs: CBC:  Recent Labs Lab 03/22/17 0620 03/22/17 0638 03/22/17 1054 03/23/17 0543 03/24/17 0251  WBC 9.2  --  12.4* 11.3* 9.7  NEUTROABS 7.2  --  10.5*  --   --   HGB 12.7 12.6 12.8 10.9* 10.4*  HCT 37.1 37.0 36.8 33.0* 31.8*  MCV 88.1  --  89.3 91.7 90.9  PLT 235  --  230 203 195   Basic Metabolic Panel:  Recent Labs Lab 03/22/17 1054 03/22/17 1427 03/22/17 1641 03/23/17 0543 03/23/17 1846 03/24/17 0251 03/25/17 0607 03/27/17 0628  NA 139  --   --  137  --  138 138 136  K 3.4*  --   --  4.3  --  3.0* 3.8 3.9  CL 107  --   --  109  --  106 106 98*  CO2 24  --   --  20*  --  26 24 28   GLUCOSE 104*  --   --  101*  --  101* 87 85  BUN <5*  --   --  5*  --  <5* <5* 8  CREATININE 0.77  --   --  0.62  --  0.52 0.47 0.64  CALCIUM 7.8*  --   --  7.9*  --  8.2* 8.9 9.4  MG 2.3 2.2 2.1 1.8 1.7  --  1.6*  --   PHOS 1.8* 2.4* 2.2* 3.4 2.9  --   --   --    Liver Function Tests:  Recent Labs Lab 03/22/17 0620 03/22/17 1054 03/27/17 0628  AST 29 26 41  ALT 12* 14 29  ALKPHOS 44 44 44  BILITOT 0.2* 0.7 0.6  PROT 6.7 7.0 7.5  ALBUMIN 3.9 4.2 4.0   No results for input(s): LIPASE, AMYLASE in the last 168 hours. No results for input(s): AMMONIA in the last 168 hours. Cardiac Enzymes:  Recent Labs Lab  03/22/17 0620 03/22/17 0627 03/22/17 1054 03/22/17 1427 03/22/17 2152  CKTOTAL  --  53  --   --   --   TROPONINI <0.03  --  <0.03 0.03* <0.03   BNP (last 3 results) No results for input(s): BNP in the last  8760 hours. CBG:  Recent Labs Lab 03/23/17 2340 03/24/17 0338 03/24/17 0803 03/24/17 1235 03/24/17 1518  GLUCAP 137* 104* 82 81 107*   Time spent: 35 minutes  Signed:  Chamia Schmutz  [facility] 03/25/2017 , 4:50 PM

## 2017-03-28 NOTE — [facility] Group Notes (Signed)
[facility]  Date/Time:    03/28/2017 10:00-11:00AM  Type of Therapy and Topic:  Group Therapy:  Healthy vs Unhealthy Coping Skills  Participation Level:  Active   Description of Group:  The focus of this group was to determine what unhealthy coping techniques typically are used by group members and what healthy coping techniques would be helpful in coping with various problems. Patients were guided in becoming aware of the differences between healthy and unhealthy coping techniques.  Patients were asked to identify 1-2 healthy coping skills they would like to learn to use more effectively, and many mentioned meditation, breathing, and relaxation.  These were explained, samples demonstrated, and resources shared for how to learn more at discharge.   At the end of group, additional ideas of healthy coping skills were shared in a fun exercise.  Therapeutic Goals 1. Patients learned that coping is what human beings do all day long to deal with various situations in their lives 2. Patients defined and discussed healthy vs unhealthy coping techniques 3. Patients identified their preferred coping techniques and identified whether these were healthy or unhealthy 4. Patients determined 1-2 healthy coping skills they would like to become more familiar with and use more often, and practiced a few meditations 5. Patients provided support and ideas to each other  Summary of Patient Progress: During group, patient expressed herself well.   Therapeutic Modalities Cognitive Behavioral Therapy Motivational Interviewing   Ambrose MantleMareida Grossman-Orr, LCSW 03/28/2017, 12:00pm

## 2017-03-28 NOTE — Progress Notes (Signed)
D.  Pt pleasant on approach, denies complaints at this time.  She reports good visit with her father.   Pt was positive for evening wrap up group with appropriate participation.  Pt observed playing cards with peers in dayroom after group.  Pt denies SI/HI/AVH at this time.   A.  Support and encouragement offered,medicaiton given as ordered  R.  Pt remains safe on the unit, will continue to monitor.

## 2017-03-28 NOTE — Progress Notes (Signed)
D Patient is observed in the dayroom. She is shy-like when she presents to this Clinical research associatewriter. She says she slept " ok" . A SHe completed her daily assessment and on this she wrote she deneid SI today and she rated her depression, hopelessness and anxeity " 2/2/4", respectively. R Safety is in place and zoloft is increased to 50 mg from 25.

## 2017-03-28 NOTE — Progress Notes (Signed)
[facility] MD Progress Note  03/28/2017 10:38 AM Stacie Douglas  MRN:  [id number]   Subjective: [name] reports " I am feeling okay today"   Objective: Syndey Douglas is awake, alert and oriented.  Seen resting in bed. Patient denies suicidal or homicidal ideation during this assessment. Denies auditory or visual hallucination and does not appear to be responding to internal stimuli. Patient reports taken Zoloft and Zyprex states she is tolerating medications well. Patient reports slight improvement with her depression today.   Reports she is has a good appetite and states she is resting well throughout the night. Support, encouragement and reassurance was provided.   Principal Problem: Bipolar I disorder, most recent episode depressed ([place]) Diagnosis:   Patient Active Problem List   Diagnosis Date Noted  . Bipolar I disorder, most recent episode depressed ([place]) [F31.30] 03/25/2017  . Acute respiratory failure ([place]) [J96.00] 03/22/2017  . Diphenhydramine overdose [T45.0X1A] 03/22/2017   Total Time spent with patient: 15 minutes  Past Psychiatric History: See H&P  Past Medical History:  Past Medical History:  Diagnosis Date  . GERD (gastroesophageal reflux disease)    History reviewed. No pertinent surgical history. Family History:  Family History  Problem Relation Age of Onset  . Autism Sister   . Autism Brother    Family Psychiatric  History: See H&P Social History:  History  Alcohol Use No     History  Drug Use No    Social History   Social History  . Marital status: Single    Spouse name: N/A  . Number of children: N/A  . Years of education: N/A   Social History Main Topics  . Smoking status: Never Smoker  . Smokeless tobacco: Never Used  . Alcohol use No  . Drug use: No  . Sexual activity: No   Other Topics Concern  . None   Social History Narrative  . None   Additional Social History:                         Sleep: Good  Appetite:  Good  Current  Medications: Current Facility-Administered Medications  Medication Dose Route Frequency Provider Last Rate Last Dose  . alum & mag hydroxide-simeth (MAALOX/MYLANTA) 200-200-20 MG/5ML suspension 30 mL  30 mL Oral Q4H PRN Rankin, Shuvon B, NP      . feeding supplement (ENSURE ENLIVE) (ENSURE ENLIVE) liquid 237 mL  237 mL Oral TID BM Cobos, Myer Peer, MD   237 mL at 03/27/17 2051  . magnesium hydroxide (MILK OF MAGNESIA) suspension 30 mL  30 mL Oral Daily PRN Rankin, Shuvon B, NP      . menthol-cetylpyridinium (CEPACOL) lozenge 3 mg  1 lozenge Oral PRN Rankin, Shuvon B, NP   3 mg at 03/27/17 2053  . OLANZapine (ZYPREXA) tablet 2.5 mg  2.5 mg Oral QHS Cobos, Myer Peer, MD   2.5 mg at 03/27/17 2100  . polyethylene glycol (MIRALAX / GLYCOLAX) packet 17 g  17 g Oral Daily Rankin, Shuvon B, NP      . senna-docusate (Senokot-S) tablet 1 tablet  1 tablet Oral QHS PRN Rankin, Shuvon B, NP      . sertraline (ZOLOFT) tablet 25 mg  25 mg Oral Daily Cobos, Myer Peer, MD   25 mg at 03/28/17 0837  . traZODone (DESYREL) tablet 50 mg  50 mg Oral QHS PRN Cobos, Myer Peer, MD   50 mg at 03/26/17 2105    Lab Results:  Results  for orders placed or performed during the hospital encounter of 03/25/17 (from the past 48 hour(s))  Comprehensive metabolic panel     Status: Abnormal   Collection Time: 03/27/17  6:28 AM  Result Value Ref Range   Sodium 136 135 - 145 mmol/L   Potassium 3.9 3.5 - 5.1 mmol/L   Chloride 98 (L) 101 - 111 mmol/L   CO2 28 22 - 32 mmol/L   Glucose, Bld 85 65 - 99 mg/dL   BUN 8 6 - 20 mg/dL   Creatinine, Ser 0.64 0.44 - 1.00 mg/dL   Calcium 9.4 8.9 - 10.3 mg/dL   Total Protein 7.5 6.5 - 8.1 g/dL   Albumin 4.0 3.5 - 5.0 g/dL   AST 41 15 - 41 U/L   ALT 29 14 - 54 U/L   Alkaline Phosphatase 44 38 - 126 U/L   Total Bilirubin 0.6 0.3 - 1.2 mg/dL   GFR calc non Af Amer >60 >60 mL/min   GFR calc Af Amer >60 >60 mL/min    Comment: (NOTE) The eGFR has been calculated using the CKD EPI  equation. This calculation has not been validated in all clinical situations. eGFR's persistently <60 mL/min signify possible Chronic Kidney Disease.    Anion gap 10 5 - 15    Comment: Performed at [facility], [place] [address] [place], [place] [address]    Blood Alcohol level:  Lab Results  Component Value Date   ETH <10 03/22/2017   ETH <5 62/69/4854    Metabolic Disorder Labs: No results found for: HGBA1C, MPG No results found for: PROLACTIN Lab Results  Component Value Date   TRIG 40 03/22/2017    Physical Findings: AIMS: Facial and Oral Movements Muscles of Facial Expression: None, normal Lips and Perioral Area: None, normal Jaw: None, normal Tongue: None, normal,Extremity Movements Upper (arms, wrists, hands, fingers): None, normal Lower (legs, knees, ankles, toes): None, normal, Trunk Movements Neck, shoulders, hips: None, normal, Overall Severity Severity of abnormal movements (highest score from questions above): None, normal Incapacitation due to abnormal movements: None, normal Patient's awareness of abnormal movements (rate only patient's report): No Awareness, Dental Status Current problems with teeth and/or dentures?: No Does patient usually wear dentures?: No  CIWA:  CIWA-Ar Total: 1 COWS:  COWS Total Score: 2  Musculoskeletal: Strength & Muscle Tone: within normal limits Gait & Station: normal Patient leans: N/A  Psychiatric Specialty Exam: Physical Exam  Nursing note and vitals reviewed. Constitutional: She is oriented to person, place, and time. She appears well-developed and well-nourished.  Cardiovascular: Normal rate.   Respiratory: Effort normal.  Musculoskeletal: Normal range of motion.  Neurological: She is alert and oriented to person, place, and time.  Skin: Skin is warm.  Psychiatric: She has a normal mood and affect. Her behavior is normal.    Review of Systems  Psychiatric/Behavioral: Positive for depression.  Negative for hallucinations and suicidal ideas. The patient is not nervous/anxious.     Blood pressure (!) 112/59, pulse (!) 130, temperature 98 F (36.7 C), temperature source Oral, resp. rate 16, height 5' 5"  (1.651 m), weight 39.9 kg (88 lb), SpO2 100 %.Body mass index is 14.64 kg/m.  General Appearance: Casual pleasant and cooperative   Eye Contact:  Good  Speech:  Clear and Coherent and Normal Rate  Volume:  Normal  Mood:  Depressed reports "slight improvement"   Affect:  Congruent  Thought Process:  Coherent and Goal Directed  Orientation:  Full (Time, Place, and Person)  Thought Content:  WDL  Suicidal Thoughts:  No  Homicidal Thoughts:  No  Memory:  Immediate;   Good Recent;   Good Remote;   Good  Judgement:  Fair  Insight:  Good  Psychomotor Activity:  Normal  Concentration:  Concentration: Good and Attention Span: Good  Recall:  Good  Fund of Knowledge:  Good  Language:  Good  Akathisia:  No  Handed:  Right  AIMS (if indicated):     Assets:  Communication Skills Desire for Improvement Physical Health Social Support  ADL's:  Intact  Cognition:  WNL  Sleep:  Number of Hours: 6.75   Problems Addressed: Bipolar I  Treatment Plan Summary: Daily contact with patient to assess and evaluate symptoms and progress in treatment and Medication management   Continue with current treatment plan on 03/28/2017 except where noted  -Continue Zyprexa 2.5 mg PO QHS for mood stability - Increased  Zoloft 25 mg to 50 mg  PO Daily for mood stability -Continue Trazodone 50 mg PO QHS PRN for insomnia -Encourage group therapy participation -Will continue to monitor vitals ,medication compliance and treatment side effects while patient is inpatient -CSW will start working on disposition.  -Patient to participate in therapeutic milieu  Derrill Center, NP 03/28/2017, 10:38 AM

## 2017-03-28 NOTE — Progress Notes (Signed)
Pt attend wrap up group. Her day was a 9. Her goal was not to sleep the day away. Pt said she need to be social with other patients on the unit. Patient said she need she working on control her thoughts. Patient said she need to eat more while she is here to get her weigh up. Pt states she wants to gain weigh.

## 2017-03-29 MED ORDER — OLANZAPINE 5 MG PO TABS
5.0000 mg | ORAL_TABLET | Freq: Every day | ORAL | Status: DC
  Administered 2017-03-29 – 2017-03-30 (×2): 5 mg via ORAL
  Filled 2017-03-29 (×5): qty 1

## 2017-03-29 MED ORDER — HYPROMELLOSE (GONIOSCOPIC) 2.5 % OP SOLN
1.0000 [drp] | Freq: Three times a day (TID) | OPHTHALMIC | Status: DC | PRN
  Filled 2017-03-29: qty 15

## 2017-03-29 MED ORDER — POLYVINYL ALCOHOL 1.4 % OP SOLN
1.0000 [drp] | Freq: Three times a day (TID) | OPHTHALMIC | Status: DC | PRN
  Administered 2017-03-31 – 2017-04-02 (×2): 1 [drp] via OPHTHALMIC
  Filled 2017-03-29 (×2): qty 15

## 2017-03-29 NOTE — Progress Notes (Signed)
D.  Pt pleasant on approach, denies complaints at this time.  Pt was positive for evening wrap up group, observed engaged in appropriate activities with peers on the unit.   Pt denies SI/HI/AVH at this time.  A.  Support and encouragement offered, medication given as ordered  R.  Pt remains safe on the unit, will continue to monitor.

## 2017-03-29 NOTE — Progress Notes (Signed)
D Patient is observed out in the milieu on the [address].Marland Kitchen.Mar[place] Kitchen.She is seen socializing and interacting with her peers. She is watching TV, listening to music and coloring pictures as ways to decrease her stress and anxeity. A SHe completed her daily assessment and on this she wrote she has experienced SI today but she contracts with Clinical research associatewriter that she will NOT hurt herself today. She rates her depression, hopelessness and anxiety " 3/0/3", respectively. Patient cont to speak in child like tone and fashion. She says " I'm beginning to feel better.." R Pt is given pos feedback by this Clinical research associatewriter - about her behavior today. Safety is in place poc cont.

## 2017-03-29 NOTE — Progress Notes (Signed)
[facility] Group Notes:  (Nursing/MHT/Case Management/Adjunct)  Date:  03/29/2017  Time:  10:05 PM  Type of Therapy:  Psychoeducational Skills  Participation Level:  Active  Participation Quality:  Appropriate  Affect:  Appropriate  Cognitive:  Appropriate  Insight:  Good  Engagement in Group:  Engaged  Modes of Intervention:  Education  Summary of Progress/Problems: Patient verbalized that her day did not begin well for her since she slept in for breakfast. She coped with the situation by calling her ex-boyfriend and dealt with her mood being "up and down". As for the theme of the day, her support system will consist of her parents and siblings.   Stacie Douglas S 03/29/2017, 10:05 PM

## 2017-03-29 NOTE — Plan of Care (Signed)
Problem: Activity: Goal: Interest or engagement in activities will improve Outcome: Progressing Pt has been attending group on the unit   

## 2017-03-29 NOTE — [facility] Group Notes (Signed)
[facility]  Date/Time:  03/29/2017 10:00-11:00AM  Type of Therapy and Topic:  Group Therapy:  Healthy and Unhealthy Supports  Participation Level:  Active   Description of Group:  Patients in this group were introduced to the idea of adding a variety of healthy supports to address the various needs in their lives.  Patients identified and described healthy supports versus unhealthy supports in general, then gave examples of each in their own lives.   They discussed what additional healthy supports could be helpful in their recovery and wellness after discharge in order to prevent future hospitalizations.   An emphasis was placed on using counseling to further understanding and planning on what to do about unhealthy people in their lives.  They also worked as a group on developing a specific plan for several patients to deal with unhealthy supports through boundary-setting, psychoeducation with loved ones, and even termination of relationships.   Therapeutic Goals:   1)  discuss importance of adding supports to stay well once out of the hospital  2)  compare healthy versus unhealthy supports and identify some examples of each  3)  generate ideas and descriptions of healthy supports that can be added  4)  offer mutual support about how to address unhealthy supports   Summary of Patient Progress:  The patient shared that the current unhealthy supports available in her life are her 2 younger siblings who have autism and are often acting out and breaking her possessions, her father and one particular girl in her life, while the current healthy supports are her mother, sister, half-sister, half-brother and friends on-line..  The patient expressed a willingness to add therapy to help in her recovery journey.   Therapeutic Modalities:   Motivational Interviewing Brief Solution-Focused Therapy  Ambrose MantleMareida Grossman-Orr, LCSW 03/29/2017, 11:00AM

## 2017-03-29 NOTE — Progress Notes (Signed)
[facility] MD Progress Note  03/29/2017 11:42 AM Stacie Douglas  MRN:  [id number]   Subjective:  Patient reports she is doing good except right after waking up she felt depressed and hd some minor passive SI, but they went away rather quickly. She feels the medication is helping and wants to continue it. She states that her thoughts are improving and she is still sleeping good.   Objective: Patient's chart and findings reviewed and discussed with treatment team. Patient has remained pleasant and cooperative. She has been attending group and interacting with peers and staff appropriately. He Zoloft was increased to 50 and will increase the Zyprexa to 5 mg QHS.   Principal Problem: Bipolar I disorder, most recent episode depressed ([facility]) Diagnosis:   Patient Active Problem List   Diagnosis Date Noted  . Bipolar I disorder, most recent episode depressed ([facility]) [F31.30] 03/25/2017  . Acute respiratory failure ([facility]) [J96.00] 03/22/2017  . Diphenhydramine overdose [T45.0X1A] 03/22/2017   Total Time spent with patient: 25 minutes  Past Psychiatric History: See H&P  Past Medical History:  Past Medical History:  Diagnosis Date  . GERD (gastroesophageal reflux disease)    History reviewed. No pertinent surgical history. Family History:  Family History  Problem Relation Age of Onset  . Autism Sister   . Autism Brother    Family Psychiatric  History: See H&P Social History:  History  Alcohol Use No     History  Drug Use No    Social History   Social History  . Marital status: Single    Spouse name: N/A  . Number of children: N/A  . Years of education: N/A   Social History Main Topics  . Smoking status: Never Smoker  . Smokeless tobacco: Never Used  . Alcohol use No  . Drug use: No  . Sexual activity: No   Other Topics Concern  . None   Social History Narrative  . None   Additional Social History:          Sleep: Good  Appetite:  Good  Current Medications: Current  Facility-Administered Medications  Medication Dose Route Frequency Provider Last Rate Last Dose  . acetaminophen (TYLENOL) tablet 650 mg  650 mg Oral Q6H PRN Sausha Raymond, Gerlene Burdock, FNP      . alum & mag hydroxide-simeth (MAALOX/MYLANTA) 200-200-20 MG/5ML suspension 30 mL  30 mL Oral Q4H PRN Rankin, Shuvon B, NP      . feeding supplement (ENSURE ENLIVE) (ENSURE ENLIVE) liquid 237 mL  237 mL Oral TID BM Cobos, Rockey Situ, MD   237 mL at 03/28/17 2206  . magnesium hydroxide (MILK OF MAGNESIA) suspension 30 mL  30 mL Oral Daily PRN Rankin, Shuvon B, NP      . menthol-cetylpyridinium (CEPACOL) lozenge 3 mg  1 lozenge Oral PRN Rankin, Shuvon B, NP   3 mg at 03/27/17 2053  . OLANZapine (ZYPREXA) tablet 5 mg  5 mg Oral QHS Anayia Eugene, Feliz Beam B, FNP      . polyethylene glycol (MIRALAX / GLYCOLAX) packet 17 g  17 g Oral Daily Rankin, Shuvon B, NP      . polyvinyl alcohol (LIQUIFILM TEARS) 1.4 % ophthalmic solution 1 drop  1 drop Both Eyes TID PRN Cobos, Rockey Situ, MD      . senna-docusate (Senokot-S) tablet 1 tablet  1 tablet Oral QHS PRN Rankin, Shuvon B, NP      . sertraline (ZOLOFT) tablet 50 mg  50 mg Oral Daily Oneta Rack, NP  50 mg at 03/29/17 0845  . traZODone (DESYREL) tablet 50 mg  50 mg Oral QHS PRN Cobos, Rockey SituFernando A, MD   50 mg at 03/26/17 2105    Lab Results:  No results found for this or any previous visit (from the past 48 hour(s)).  Blood Alcohol level:  Lab Results  Component Value Date   ETH <10 03/22/2017   ETH <5 05/22/2016    Metabolic Disorder Labs: No results found for: HGBA1C, MPG No results found for: PROLACTIN Lab Results  Component Value Date   TRIG 40 03/22/2017    Physical Findings: AIMS: Facial and Oral Movements Muscles of Facial Expression: None, normal Lips and Perioral Area: None, normal Jaw: None, normal Tongue: None, normal,Extremity Movements Upper (arms, wrists, hands, fingers): None, normal Lower (legs, knees, ankles, toes): None, normal, Trunk  Movements Neck, shoulders, hips: None, normal, Overall Severity Severity of abnormal movements (highest score from questions above): None, normal Incapacitation due to abnormal movements: None, normal Patient's awareness of abnormal movements (rate only patient's report): No Awareness, Dental Status Current problems with teeth and/or dentures?: No Does patient usually wear dentures?: No  CIWA:  CIWA-Ar Total: 1 COWS:  COWS Total Score: 2  Musculoskeletal: Strength & Muscle Tone: within normal limits Gait & Station: normal Patient leans: N/A  Psychiatric Specialty Exam: Physical Exam  Nursing note and vitals reviewed. Constitutional: She is oriented to person, place, and time. She appears well-developed and well-nourished.  Respiratory: Effort normal.  Musculoskeletal: Normal range of motion.  Neurological: She is alert and oriented to person, place, and time.  Skin: Skin is warm.    Review of Systems  Constitutional: Negative.   HENT: Negative.   Respiratory: Negative.   Cardiovascular: Negative.   Gastrointestinal: Negative.   Genitourinary: Negative.   Musculoskeletal: Negative.   Skin: Negative.   Neurological: Negative.   Endo/Heme/Allergies: Negative.   Psychiatric/Behavioral: Positive for depression and suicidal ideas (this morning right after waking up but resolved). Negative for hallucinations. The patient is not nervous/anxious.     Blood pressure (!) 113/56, pulse (!) 130, temperature 98.4 F (36.9 C), temperature source Oral, resp. rate 16, height 5\' 5"  (1.651 m), weight 39.9 kg (88 lb), SpO2 100 %.Body mass index is 14.64 kg/m.  General Appearance: Disheveled  Eye Contact:  Good  Speech:  Clear and Coherent and Normal Rate  Volume:  Normal  Mood:  Depressed  Affect:  Congruent  Thought Process:  Goal Directed and Descriptions of Associations: Intact  Orientation:  Full (Time, Place, and Person)  Thought Content:  WDL  Suicidal Thoughts:  No  Homicidal  Thoughts:  No  Memory:  Immediate;   Good Recent;   Good Remote;   Good  Judgement:  Fair  Insight:  Good  Psychomotor Activity:  Normal  Concentration:  Concentration: Good and Attention Span: Good  Recall:  Good  Fund of Knowledge:  Good  Language:  Good  Akathisia:  No  Handed:  Right  AIMS (if indicated):     Assets:  Communication Skills Desire for Improvement Financial Resources/Insurance Housing Physical Health Social Support Transportation  ADL's:  Intact  Cognition:  WNL  Sleep:  Number of Hours: 6.75   Problems Addressed: Bipolar I  Treatment Plan Summary: Daily contact with patient to assess and evaluate symptoms and progress in treatment, Medication management and Plan is to:  -Increase Zyprexa 5 mg PO QHS for mood stability -Continue Zoloft 50 mg PO Daily for mood stability -Continue Trazodone 50 mg  PO QHS PRN for insomnia -Encourage group therapy participation  Maryfrances Bunnell, FNP 03/29/2017, 11:42 AM

## 2017-03-30 MED ORDER — PROPRANOLOL HCL 10 MG PO TABS
10.0000 mg | ORAL_TABLET | Freq: Two times a day (BID) | ORAL | Status: DC
  Administered 2017-03-30 – 2017-03-31 (×3): 10 mg via ORAL
  Filled 2017-03-30 (×8): qty 1

## 2017-03-30 NOTE — Progress Notes (Signed)
Recreation Therapy Notes  *Date: 03/30/17 Time: 0930 Location: [address] Dayroom  Group Topic: Stress Management  Goal Area(s) Addresses:  Patient will verbalize importance of using healthy stress management.  Patient will identify positive emotions associated with healthy stress management.   Behavioral Response: Engaged  Intervention: Stress Management  Activity :  Meditation.  LRT introduced the stress management technique of meditation.  LRT read script that allowed patients to sit quietly, focus on their breathing and just being.  Education:  Stress Management, Discharge Planning.   Education Outcome: Acknowledges edcuation/In group clarification offered/Needs additional education  Clinical Observations/Feedback: Pt attended group.    Caroll RancherMarjette Ranelle Auker, LRT/CTRS **        Lillia AbedLindsay, Starling Christofferson A 03/30/2017 11:06 AM

## 2017-03-30 NOTE — Progress Notes (Signed)
D: Pt presents with a flat affect and anx/dep mood. Pt reports decreased depression 2/10. Pt reports fair sleep.  Pt endorses passive SI with no plan or intent. Pt verbally contracts for safety. Pt endorses VH, seeing images of killing herself. Pt verbalized that she's tolerating her meds well. No side effects to meds verbalized by pt. A: Medications reviewed with pt. Medications administered as ordered per MD. Verbal support provided. Pt encouraged to attend groups. 15 minute checks performed for safety.  R: Pt compliant with tx. Pt stated goal on her self inventory sheet "I want to try to get my happy spirits back and forget about the past".

## 2017-03-30 NOTE — [facility] Group Notes (Signed)
LCSW Group Therapy Note   03/30/2017 1:15pm   Type of Therapy and Topic:  Group Therapy:  Overcoming Obstacles   Participation Level:  Active   Description of Group:    In this group patients will be encouraged to explore what they see as obstacles to their own wellness and recovery. They will be guided to discuss their thoughts, feelings, and behaviors related to these obstacles. The group will process together ways to cope with barriers, with attention given to specific choices patients can make. Each patient will be challenged to identify changes they are motivated to make in order to overcome their obstacles. This group will be process-oriented, with patients participating in exploration of their own experiences as well as giving and receiving support and challenge from other group members.   Therapeutic Goals: 1. Patient will identify personal and current obstacles as they relate to admission. 2. Patient will identify barriers that currently interfere with their wellness or overcoming obstacles.  3. Patient will identify feelings, thought process and behaviors related to these barriers. 4. Patient will identify two changes they are willing to make to overcome these obstacles:      Summary of Patient Progress Pt participation was focused on relationship concerns. She identified that her anger has been problematic in relationships as she says hurtful things.      Therapeutic Modalities:   Cognitive Behavioral Therapy Solution Focused Therapy Motivational Interviewing Relapse Prevention Therapy  Verdene LennertLauren C Calen Geister, LCSW 03/30/2017 4:14 PM

## 2017-03-30 NOTE — Progress Notes (Signed)
[facility]  03/30/2017 2:23 PM Stacie FolkKaren Davilla  MRN:  [id number]   Subjective: Stacie Douglas reports, "I feel a little better, it's just the suicidal thoughts is off & on".  Objective: Stacie Douglas is seen, chart reviewed,  findings discussed with the  treatment team. Stacie Douglas presents with a flat affect, disheveled. She is visible on the unit. She is attending group sessions. She says she is feeling a little better, but continues to have suicidal thoughts off & on. She denies any intent or plans to hurt herself or anyone else. She is able to contract for safety. She denies any HI, AVH, does not appear to be responding to any internal stimuli. She is taking & tolerating her medication regimen without any adverse effects reported. Staff continues to provide support.  Principal Problem: Bipolar I disorder, most recent episode depressed (HCC)  Diagnosis:   Patient Active Problem List   Diagnosis Date Noted  . Bipolar I disorder, most recent episode depressed (HCC) [F31.30] 03/25/2017  . Acute respiratory failure (HCC) [J96.00] 03/22/2017  . Diphenhydramine overdose [T45.0X1A] 03/22/2017   Total Time spent with patient: 15 minutes  Past Psychiatric History: See H&P  Past Medical History:  Past Medical History:  Diagnosis Date  . GERD (gastroesophageal reflux disease)    History reviewed. No pertinent surgical history. Family History:  Family History  Problem Relation Age of Onset  . Autism Sister   . Autism Brother    Family Psychiatric  History: See H&P Social History:  History  Alcohol Use No     History  Drug Use No    Social History   Social History  . Marital status: Single    Spouse name: N/A  . Number of children: N/A  . Years of education: N/A   Social History Main Topics  . Smoking status: Never Smoker  . Smokeless tobacco: Never Used  . Alcohol use No  . Drug use: No  . Sexual activity: No   Other Topics Concern  . None   Social History Narrative  . None    Additional Social History:   Sleep: Good  Appetite:  Good  Current Medications: Current Facility-Administered Medications  Medication Dose Route Frequency Provider Last Rate Last Dose  . acetaminophen (TYLENOL) tablet 650 mg  650 mg Oral Q6H PRN Money, Gerlene Burdockravis B, FNP   650 mg at 03/30/17 1021  . alum & mag hydroxide-simeth (MAALOX/MYLANTA) 200-200-20 MG/5ML suspension 30 mL  30 mL Oral Q4H PRN Rankin, Shuvon B, NP      . feeding supplement (ENSURE ENLIVE) (ENSURE ENLIVE) liquid 237 mL  237 mL Oral TID BM Cobos, Rockey SituFernando A, MD   237 mL at 03/30/17 1302  . magnesium hydroxide (MILK OF MAGNESIA) suspension 30 mL  30 mL Oral Daily PRN Rankin, Shuvon B, NP      . menthol-cetylpyridinium (CEPACOL) lozenge 3 mg  1 lozenge Oral PRN Rankin, Shuvon B, NP   3 mg at 03/27/17 2053  . OLANZapine (ZYPREXA) tablet 5 mg  5 mg Oral QHS Money, Gerlene Burdockravis B, FNP   5 mg at 03/29/17 2116  . polyethylene glycol (MIRALAX / GLYCOLAX) packet 17 g  17 g Oral Daily Rankin, Shuvon B, NP      . polyvinyl alcohol (LIQUIFILM TEARS) 1.4 % ophthalmic solution 1 drop  1 drop Both Eyes TID PRN Cobos, Fernando A, MD      . propranolol (INDERAL) tablet 10 mg  10 mg Oral BID Sanjuana KavaNwoko, Keigen Caddell I, NP   10  mg at 03/30/17 1301  . senna-docusate (Senokot-S) tablet 1 tablet  1 tablet Oral QHS PRN Rankin, Shuvon B, NP      . sertraline (ZOLOFT) tablet 50 mg  50 mg Oral Daily Oneta Rack, NP   50 mg at 03/30/17 0815  . traZODone (DESYREL) tablet 50 mg  50 mg Oral QHS PRN Cobos, Rockey Situ, MD   50 mg at 03/26/17 2105   Lab Results:  No results found for this or any previous visit (from the past 48 hour(s)).  Blood Alcohol level:  Lab Results  Component Value Date   ETH <10 03/22/2017   ETH <5 05/22/2016   Metabolic Disorder Labs: No results found for: HGBA1C, MPG No results found for: PROLACTIN Lab Results  Component Value Date   TRIG 40 03/22/2017   Physical Findings: AIMS: Facial and Oral Movements Muscles of Facial  Expression: None, normal Lips and Perioral Area: None, normal Jaw: None, normal Tongue: None, normal,Extremity Movements Upper (arms, wrists, hands, fingers): None, normal Lower (legs, knees, ankles, toes): None, normal, Trunk Movements Neck, shoulders, hips: None, normal, Overall Severity Severity of abnormal movements (highest score from questions above): None, normal Incapacitation due to abnormal movements: None, normal Patient's awareness of abnormal movements (rate only patient's report): No Awareness, Dental Status Current problems with teeth and/or dentures?: No Does patient usually wear dentures?: No  CIWA:  CIWA-Ar Total: 1 COWS:  COWS Total Score: 2  Musculoskeletal: Strength & Muscle Tone: within normal limits Gait & Station: normal Patient leans: N/A  Psychiatric Specialty Exam: Physical Exam  Nursing note and vitals reviewed. Constitutional: She is oriented to person, place, and time. She appears well-developed and well-nourished.  Respiratory: Effort normal.  Musculoskeletal: Normal range of motion.  Neurological: She is alert and oriented to person, place, and time.  Skin: Skin is warm.    Review of Systems  Constitutional: Negative.   HENT: Negative.   Respiratory: Negative.   Cardiovascular: Negative.   Gastrointestinal: Negative.   Genitourinary: Negative.   Musculoskeletal: Negative.   Skin: Negative.   Neurological: Negative.   Endo/Heme/Allergies: Negative.   Psychiatric/Behavioral: Positive for depression and suicidal ideas (this morning right after waking up but resolved). Negative for hallucinations. The patient is not nervous/anxious.     Blood pressure 117/71, pulse (!) 130, temperature 98.7 F (37.1 C), resp. rate 16, height 5\' 5"  (1.651 m), weight 39.9 kg (88 lb), SpO2 100 %.Body mass index is 14.64 kg/m.  General Appearance: Disheveled, quiet  Eye Contact:  Good  Speech:  Clear and Coherent and Normal Rate  Volume:  Normal  Mood:   Depressed, presents with sad facial expression.  Affect:  Congruent and Flat  Thought Process:  Coherent, Goal Directed and Descriptions of Associations: Intact  Orientation:  Full (Time, Place, and Person)  Thought Content:  Ruminations, denies any hallucinations, delusional thoughts or paranoia.  Suicidal Thoughts:  Denies any thoughts, plans or intent  Homicidal Thoughts:  No  Memory:  Immediate;   Good Recent;   Good Remote;   Good  Judgement:  Fair  Insight:  Good  Psychomotor Activity:  Normal  Concentration:  Concentration: Good and Attention Span: Good  Recall:  Good  Fund of Knowledge:  Good  Language:  Good  Akathisia:  No  Handed:  Right  AIMS (if indicated):     Assets:  Communication Skills Desire for Improvement Financial Resources/Insurance Housing Physical Health Social Support Transportation  ADL's:  Intact  Cognition:  WNL  Sleep:  Number of Hours: 6.5   Problems Addressed: Bipolar I  Treatment Plan Summary: Daily contact with patient to assess and evaluate symptoms and progress in treatment, Medication management and Plan is to:   Will continue today 03/30/17 plan as below except where it is noted.  -Continue Zyprexa 5 mg PO Q HS for mood stability  -Continue Zoloft 50 mg PO Daily for depression  -Continue Trazodone 50 mg PO Q HS PRN for insomnia  -Encourage group therapy participation.  -Sw to continue to work on discharge disposition.  Sanjuana Kava, NP, PMHNP, FNP-BC. 03/30/2017, 2:23 PMPatient ID: Stacie Douglas, female   DOB: 10-May-1996, 21 y.o.   MRN: [id number]

## 2017-03-31 MED ORDER — FLUTICASONE PROPIONATE 50 MCG/ACT NA SUSP
1.0000 | Freq: Every day | NASAL | Status: DC
  Administered 2017-03-31 – 2017-04-02 (×3): 1 via NASAL
  Filled 2017-03-31: qty 16

## 2017-03-31 MED ORDER — OLANZAPINE 2.5 MG PO TABS
2.5000 mg | ORAL_TABLET | Freq: Every day | ORAL | Status: DC
  Administered 2017-03-31: 2.5 mg via ORAL
  Filled 2017-03-31 (×2): qty 1

## 2017-03-31 NOTE — Progress Notes (Signed)
Adult Psychoeducational Group Note  Date:  03/31/2017 Time:  9:19 PM  Group Topic/Focus:  Wrap-Up Group:   The focus of this group is to help patients review their daily goal of treatment and discuss progress on daily workbooks.  Participation Level:  Active  Participation Quality:  Appropriate  Affect:  Appropriate  Cognitive:  Alert and Appropriate  Insight: Good  Engagement in Group:  Engaged  Modes of Intervention:  Activity  Additional Comments:  Pt rated her day a 7. Goal is to be in a better mood and continue to be sociable.  Claria DiceKiara M Nargis Abrams 03/31/2017, 9:19 PM

## 2017-03-31 NOTE — Progress Notes (Signed)
D: Patient pleasant and cooperative with care this shift and is noted to interact well with peers in the milieu. Pt with child-like behaviors and mannerisms. A: Encourage staff/peer interaction, medication compliance, and group participation. Administer medications as ordered, maintain Q 15 minute safety checks. R: Pt compliant with medications and attended group session. Pt denies SI at this time and verbally contracts for safety. No signs/symptoms of distress noted.

## 2017-03-31 NOTE — Progress Notes (Signed)
D: Pt presents animated, anxious and childlike on approach. Pt reports decreased depression 3/10. Anxiety 2/10. Pt denies SI/HI. Pt denies AVH. Pt reported poor sleep last night due to racing thoughts. Pt requesting to have sleep med adjusted for tonight. Tx made aware of pt request.  A: Medications reviewed with pt. Medications administered as ordered per MD. Verbal support provided. Pt encouraged to attend groups. 15 minute checks performed for safety.  R: Pt compliant with tx.

## 2017-03-31 NOTE — Progress Notes (Signed)
Recreation Therapy Notes  Animal-Assisted Activity (AAA) Program Checklist/Progress Notes Patient Eligibility Criteria Checklist & Daily Group note for Rec TxIntervention  Date: 10.30.2018 Time: 2:45pm Location: [address] Dayroom   AAA/T Program Assumption of Risk Form signed by Patient/ or Parent Legal Guardian Yes  Patient is free of allergies or sever asthma Yes  Patient reports no fear of animals Yes  Patient reports no history of cruelty to animals Yes  Patient understands his/her participation is voluntary Yes  Patient washes hands before animal contact Yes  Patient washes hands after animal contact Yes  Behavioral Response: Appropriate   Education:Hand Washing, Appropriate Animal Interaction   Education Outcome: Acknowledges education.   Clinical Observations/Feedback: Patient attended session and interacted appropriately with therapy dog and peers.   Stacie Douglas, LRT/CTRS      Linday Rhodes L 03/31/2017 3:05 PM 

## 2017-03-31 NOTE — Progress Notes (Signed)
[facility] MD Progress Note  03/31/2017 1:02 PM Anahi Belmar  MRN:  [id number]   Subjective: patient reports feeling " kind of better". Denies suicidal ideations.  Describes some dizziness and lightheadedness when she stands up from bed , but states it resolves and does not have it during daytime. Denies medication side effects at this time.  Objective: I have discussed case with treatment team and have met with patient . As per staff, patient presents interactive, cooperative on approach, presenting with child like mannerisms and speech at times . Presents with partially improved mood and range of affect. Denies suicidal ideations at this time. Behavior on unit calm and in good control, interactive with peers. As above, reports mild dizziness,lightheadedness in AM but gait is steady.  At this time she is future oriented, states that once she completes high school she is hoping to start going to college, and feels that a goal for her is to eventually be able to leave home and live independently.    Principal Problem: Bipolar I disorder, most recent episode depressed ([place])  Diagnosis:   Patient Active Problem List   Diagnosis Date Noted  . Bipolar I disorder, most recent episode depressed ([place]) [F31.30] 03/25/2017  . Acute respiratory failure ([place]) [J96.00] 03/22/2017  . Diphenhydramine overdose [T45.0X1A] 03/22/2017   Total Time spent with patient: 20 minutes  Past Psychiatric History: See H&P  Past Medical History:  Past Medical History:  Diagnosis Date  . GERD (gastroesophageal reflux disease)    History reviewed. No pertinent surgical history. Family History:  Family History  Problem Relation Age of Onset  . Autism Sister   . Autism Brother    Family Psychiatric  History: See H&P Social History:  History  Alcohol Use No     History  Drug Use No    Social History   Social History  . Marital status: Single    Spouse name: N/A  . Number of children: N/A  . Years of  education: N/A   Social History Main Topics  . Smoking status: Never Smoker  . Smokeless tobacco: Never Used  . Alcohol use No  . Drug use: No  . Sexual activity: No   Other Topics Concern  . None   Social History Narrative  . None   Additional Social History:   Sleep: Good  Appetite:  Good  Current Medications: Current Facility-Administered Medications  Medication Dose Route Frequency Provider Last Rate Last Dose  . acetaminophen (TYLENOL) tablet 650 mg  650 mg Oral Q6H PRN Money, Lowry Ram, FNP   650 mg at 03/31/17 0959  . alum & mag hydroxide-simeth (MAALOX/MYLANTA) 200-200-20 MG/5ML suspension 30 mL  30 mL Oral Q4H PRN Rankin, Shuvon B, NP      . feeding supplement (ENSURE ENLIVE) (ENSURE ENLIVE) liquid 237 mL  237 mL Oral TID BM Cobos, Myer Peer, MD   237 mL at 03/31/17 0958  . fluticasone (FLONASE) 50 MCG/ACT nasal spray 1 spray  1 spray Each Nare Daily Money, Lowry Ram, FNP   1 spray at 03/31/17 1159  . magnesium hydroxide (MILK OF MAGNESIA) suspension 30 mL  30 mL Oral Daily PRN Rankin, Shuvon B, NP      . menthol-cetylpyridinium (CEPACOL) lozenge 3 mg  1 lozenge Oral PRN Rankin, Shuvon B, NP   3 mg at 03/27/17 2053  . OLANZapine (ZYPREXA) tablet 5 mg  5 mg Oral QHS Money, Lowry Ram, FNP   5 mg at 03/30/17 2157  . polyethylene glycol (MIRALAX /  GLYCOLAX) packet 17 g  17 g Oral Daily Rankin, Shuvon B, NP      . polyvinyl alcohol (LIQUIFILM TEARS) 1.4 % ophthalmic solution 1 drop  1 drop Both Eyes TID PRN Cobos, Myer Peer, MD   1 drop at 03/31/17 0958  . propranolol (INDERAL) tablet 10 mg  10 mg Oral BID Lindell Spar I, NP   10 mg at 03/31/17 0745  . senna-docusate (Senokot-S) tablet 1 tablet  1 tablet Oral QHS PRN Rankin, Shuvon B, NP      . sertraline (ZOLOFT) tablet 50 mg  50 mg Oral Daily Derrill Center, NP   50 mg at 03/31/17 0745  . traZODone (DESYREL) tablet 50 mg  50 mg Oral QHS PRN Cobos, Myer Peer, MD   50 mg at 03/30/17 2157   Lab Results:  No results found  for this or any previous visit (from the past 48 hour(s)).  Blood Alcohol level:  Lab Results  Component Value Date   ETH <10 03/22/2017   ETH <5 02/58/5277   Metabolic Disorder Labs: No results found for: HGBA1C, MPG No results found for: PROLACTIN Lab Results  Component Value Date   TRIG 40 03/22/2017   Physical Findings: AIMS: Facial and Oral Movements Muscles of Facial Expression: None, normal Lips and Perioral Area: None, normal Jaw: None, normal Tongue: None, normal,Extremity Movements Upper (arms, wrists, hands, fingers): None, normal Lower (legs, knees, ankles, toes): None, normal, Trunk Movements Neck, shoulders, hips: None, normal, Overall Severity Severity of abnormal movements (highest score from questions above): None, normal Incapacitation due to abnormal movements: None, normal Patient's awareness of abnormal movements (rate only patient's report): No Awareness, Dental Status Current problems with teeth and/or dentures?: No Does patient usually wear dentures?: No  CIWA:  CIWA-Ar Total: 1 COWS:  COWS Total Score: 2  Musculoskeletal: Strength & Muscle Tone: within normal limits Gait & Station: normal Patient leans: N/A  Psychiatric Specialty Exam: Physical Exam  Nursing note and vitals reviewed. Constitutional: She is oriented to person, place, and time. She appears well-developed and well-nourished.  Respiratory: Effort normal.  Musculoskeletal: Normal range of motion.  Neurological: She is alert and oriented to person, place, and time.  Skin: Skin is warm.    Review of Systems  Constitutional: Negative.   HENT: Negative.   Respiratory: Negative.   Cardiovascular: Negative.   Gastrointestinal: Negative.   Genitourinary: Negative.   Musculoskeletal: Negative.   Skin: Negative.   Neurological: Negative.   Endo/Heme/Allergies: Negative.   Psychiatric/Behavioral: Positive for depression and suicidal ideas (this morning right after waking up but  resolved). Negative for hallucinations. The patient is not nervous/anxious.   AM dizziness,lightheadedness, currently improved. No chest pain, no shortness of breath. No vomiting.  Blood pressure 107/60, pulse (!) 108, temperature 98.5 F (36.9 C), temperature source Oral, resp. rate 16, height _0  (1.651 m), weight 39.9 kg (88 lb), SpO2 100 %.Body mass index is 14.64 kg/m.  General Appearance: improved grooming   Eye Contact:  Good  Speech:  Normal Rate  Volume:  Normal  Mood:  improving , vaguely anxious   Affect:  more reactive  Thought Process:  Linear and Descriptions of Associations: Intact  Orientation:  Other:  fully alert and attentive   Thought Content:  no hallucinations, no delusions, not internally preoccupied   Suicidal Thoughts:  No currently denies suicidal ideations, contracts for safety on unit, denies homicidal or violent ideations  Homicidal Thoughts:  No  Memory:  Immediate;   Good  Recent;   Good Remote;   Good  Judgement:  Improving   Insight:  improving   Psychomotor Activity:  Normal  Concentration:  Concentration: Good and Attention Span: Good  Recall:  Good  Fund of Knowledge:  Good  Language:  Good  Akathisia:  No  Handed:  Right  AIMS (if indicated):     Assets:  Communication Skills Desire for Improvement Financial Resources/Insurance Housing Physical Health Social Support Transportation  ADL's:  Intact  Cognition:  WNL  Sleep:  Number of Hours: 6   Assessment- patient reports improving mood and feels better than on admission.Appetite is improving .Denies current suicidal ideations and is future oriented . Denies medication side effects, but is describing some AM dizziness, lightheadedness. At this time gait is steady and BP is 107/60. Of note, patient has strong family history of autism( both her siblings as per her report). She  is not currently demonstrating restrictive or stereotyped behaviors or language, and is socially interactive, with  appropriate verbal communication. Treatment Plan Summary: Treatment Plan reviewed as below today 10/30 Daily contact with patient to assess and evaluate symptoms and progress in treatment, Medication management and Plan is to:   Encourage group and milieu participation to work on coping skills and symptom reduction   -Decrease  Zyprexa to 2.5 mg PO Q HS for mood disorder - rationale for decreasing dose is to minimize possible side effects such as dizziness,  orthostasis and palpitations   -Continue Zoloft 50 mg PO Daily for depression/anxiety   -Continue Trazodone 50 mg PO Q HS PRN for insomnia  -Treatment Team working on disposition planning options  -Encourage PO fluids   -reheck BMP and CBC in AM  Jenne Campus, MD 03/31/2017, 1:02 PM   Patient ID: Carlos Levering, female   DOB: [birth year], 21 y.o.   MRN: [id number]

## 2017-03-31 NOTE — Progress Notes (Signed)
Adult Psychoeducational Group Note  Date:  03/31/2017 Time:  12:01 AM  Group Topic/Focus:  Wrap-Up Group:   The focus of this group is to help patients review their daily goal of treatment and discuss progress on daily workbooks.  Participation Level:  Active  Participation Quality:  Appropriate  Affect:  Appropriate  Cognitive:  Oriented  Insight: Good  Engagement in Group:  Engaged  Modes of Intervention:  Support  Additional Comments:  Pt rated her day a 10. Pt wants to manage thoughts and become more positive.  Claria DiceKiara M Jaion Lagrange 03/31/2017, 12:01 AM

## 2017-03-31 NOTE — [facility] Group Notes (Signed)
LCSW Group Therapy Note  03/31/2017 1:15pm  Type of Therapy/Topic:  Group Therapy:  Feelings about Diagnosis  Participation Level:  Did Not Attend   Description of Group:   This group will allow patients to explore their thoughts and feelings about diagnoses they have received. Patients will be guided to explore their level of understanding and acceptance of these diagnoses. Facilitator will encourage patients to process their thoughts and feelings about the reactions of others to their diagnosis and will guide patients in identifying ways to discuss their diagnosis with significant others in their lives. This group will be process-oriented, with patients participating in exploration of their own experiences, giving and receiving support, and processing challenge from other group members.   Therapeutic Goals: 1. Patient will demonstrate understanding of diagnosis as evidenced by identifying two or more symptoms of the disorder 2. Patient will be able to express two feelings regarding the diagnosis 3. Patient will demonstrate their ability to communicate their needs through discussion and/or role play   Therapeutic Modalities:   Cognitive Behavioral Therapy Brief Therapy Feelings Identification    Verdene LennertLauren C Trevious Rampey, LCSW 03/31/2017 3:51 PM

## 2017-03-31 NOTE — Plan of Care (Signed)
Problem: Medication: Goal: Compliance with prescribed medication regimen will improve Outcome: Progressing Pt compliant with med regimen. No side effects to meds verbalized by pt.    

## 2017-04-01 LAB — CBC WITH DIFFERENTIAL/PLATELET
Basophils Absolute: 0 10*3/uL (ref 0.0–0.1)
Basophils Relative: 0 %
Eosinophils Absolute: 0.2 10*3/uL (ref 0.0–0.7)
Eosinophils Relative: 2 %
HEMATOCRIT: 34 % — AB (ref 36.0–46.0)
Hemoglobin: 11.4 g/dL — ABNORMAL LOW (ref 12.0–15.0)
LYMPHS ABS: 2.8 10*3/uL (ref 0.7–4.0)
LYMPHS PCT: 38 %
MCH: 30.6 pg (ref 26.0–34.0)
MCHC: 33.5 g/dL (ref 30.0–36.0)
MCV: 91.2 fL (ref 78.0–100.0)
MONO ABS: 0.4 10*3/uL (ref 0.1–1.0)
Monocytes Relative: 5 %
NEUTROS ABS: 4 10*3/uL (ref 1.7–7.7)
Neutrophils Relative %: 55 %
Platelets: 270 10*3/uL (ref 150–400)
RBC: 3.73 MIL/uL — AB (ref 3.87–5.11)
RDW: 13.7 % (ref 11.5–15.5)
WBC: 7.4 10*3/uL (ref 4.0–10.5)

## 2017-04-01 LAB — BASIC METABOLIC PANEL
ANION GAP: 13 (ref 5–15)
BUN: 10 mg/dL (ref 6–20)
CO2: 22 mmol/L (ref 22–32)
Calcium: 8.9 mg/dL (ref 8.9–10.3)
Chloride: 102 mmol/L (ref 101–111)
Creatinine, Ser: 0.58 mg/dL (ref 0.44–1.00)
GFR calc Af Amer: >60 mL/min (ref >60)
GFR calc non Af Amer: >60 mL/min (ref >60)
GLUCOSE: 72 mg/dL (ref 65–99)
POTASSIUM: 3.8 mmol/L (ref 3.5–5.1)
Sodium: 137 mmol/L (ref 135–145)

## 2017-04-01 MED ORDER — TRAZODONE HCL 100 MG PO TABS
100.0000 mg | ORAL_TABLET | Freq: Every evening | ORAL | Status: DC | PRN
  Administered 2017-04-01: 100 mg via ORAL
  Filled 2017-04-01: qty 1

## 2017-04-01 MED ORDER — TRAZODONE HCL 50 MG PO TABS
50.0000 mg | ORAL_TABLET | Freq: Once | ORAL | Status: AC
  Administered 2017-04-01: 50 mg via ORAL
  Filled 2017-04-01: qty 1

## 2017-04-01 MED ORDER — SERTRALINE HCL 25 MG PO TABS
75.0000 mg | ORAL_TABLET | Freq: Every day | ORAL | Status: DC
  Administered 2017-04-02: 75 mg via ORAL
  Filled 2017-04-01 (×3): qty 3

## 2017-04-01 MED ORDER — OLANZAPINE 5 MG PO TABS
5.0000 mg | ORAL_TABLET | Freq: Every day | ORAL | Status: DC
  Administered 2017-04-01: 5 mg via ORAL
  Filled 2017-04-01 (×4): qty 1

## 2017-04-01 NOTE — [facility] Suicide Risk Assessment (Signed)
[facility] INPATIENT:  Family/Significant Other Suicide Prevention Education  Suicide Prevention Education:  Contact Attempts: Carney LivingJacqueline Knoedler, Pt's mother [phone], (name of family member/significant other) has been identified by the patient as the family member/significant other with whom the patient will be residing, and identified as the person(s) who will aid the patient in the event of a mental health crisis.  With written consent from the patient, two attempts were made to provide suicide prevention education, prior to and/or following the patient's discharge.  We were unsuccessful in providing suicide prevention education.  A suicide education pamphlet was given to the patient to share with family/significant other.  Date and time of first attempt: 03/31/17 @ 0946; voicemail left Date and time of second attempt: 04/01/17 @ 1506; voicemail left  Verdene LennertLauren C Kearstyn Avitia 04/01/2017, 3:04 PM

## 2017-04-01 NOTE — Tx Team (Signed)
Interdisciplinary Treatment and Diagnostic Plan Update 04/01/2017 Time of Session: 9:30am  Stacie FolkKaren Douglas  MRN: [id number]  Principal Diagnosis: Bipolar I disorder, most recent episode depressed (HCC)  Secondary Diagnoses: Principal Problem:   Bipolar I disorder, most recent episode depressed (HCC) Active Problems:   Diphenhydramine overdose   Current Medications:  Current Facility-Administered Medications  Medication Dose Route Frequency Provider Last Rate Last Dose  . acetaminophen (TYLENOL) tablet 650 mg  650 mg Oral Q6H PRN Money, Stacie Douglas B, FNP   650 mg at 03/31/17 0959  . alum & mag hydroxide-simeth (MAALOX/MYLANTA) 200-200-20 MG/5ML suspension 30 mL  30 mL Oral Q4H PRN Douglas, Stacie B, NP   30 mL at 04/01/17 0102  . feeding supplement (ENSURE ENLIVE) (ENSURE ENLIVE) liquid 237 mL  237 mL Oral TID BM Cobos, Stacie SituFernando A, MD   237 mL at 03/31/17 1951  . fluticasone (FLONASE) 50 MCG/ACT nasal spray 1 spray  1 spray Each Nare Daily Money, Stacie Douglas B, FNP   1 spray at 03/31/17 1159  . magnesium hydroxide (MILK OF MAGNESIA) suspension 30 mL  30 mL Oral Daily PRN Douglas, Stacie B, NP      . menthol-cetylpyridinium (CEPACOL) lozenge 3 mg  1 lozenge Oral PRN Douglas, Stacie B, NP   3 mg at 03/27/17 2053  . OLANZapine (ZYPREXA) tablet 2.5 mg  2.5 mg Oral QHS Cobos, Stacie SituFernando A, MD   2.5 mg at 03/31/17 2257  . polyethylene glycol (MIRALAX / GLYCOLAX) packet 17 g  17 g Oral Daily Douglas, Stacie B, NP      . polyvinyl alcohol (LIQUIFILM TEARS) 1.4 % ophthalmic solution 1 drop  1 drop Both Eyes TID PRN Cobos, Stacie SituFernando A, MD   1 drop at 03/31/17 0958  . senna-docusate (Senokot-S) tablet 1 tablet  1 tablet Oral QHS PRN Douglas, Stacie B, NP      . sertraline (ZOLOFT) tablet 50 mg  50 mg Oral Daily Oneta Douglas, Stacie N, NP   50 mg at 03/31/17 0745  . traZODone (DESYREL) tablet 50 mg  50 mg Oral QHS PRN Cobos, Stacie SituFernando A, MD   50 mg at 03/31/17 2257    PTA Medications: Prescriptions Prior to Admission   Medication Sig Dispense Refill Last Dose  . ibuprofen (ADVIL,MOTRIN) 200 MG tablet Take 400 mg by mouth every 6 (six) hours as needed. Takes 40 mg after that she takes 200 mg every 4 hours   03/21/2017 at Unknown time  . OVER THE COUNTER MEDICATION Take 500 mg by mouth 4 (four) times daily. Elder Stacie Douglas Her mother combines the above ingredient to Douglas power form and put it in Douglas capsule of 500 mg and she takes it four times Douglas day   03/21/2017 at Unknown time  . polyethylene glycol (MIRALAX / GLYCOLAX) packet Take 17 g by mouth daily. 14 each 0     Treatment Modalities: Medication Management, Group therapy, Case management,  1 to 1 session with clinician, Psychoeducation, Recreational therapy.  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  Therapeutic Interventions: 1 to 1 sessions, Unit Group sessions and Medication administration.  Evaluation of Outcomes: Adequate for Discharge  Physician Treatment Plan for Secondary Diagnosis: Principal Problem:   Bipolar I disorder, most recent episode depressed (HCC) Active Problems:   Diphenhydramine overdose  Long Term Goal(s): Improvement in symptoms so as ready for discharge  Short Term Goals: Ability to  verbalize feelings will improve Ability to identify and develop effective coping behaviors will improve Ability to verbalize feelings will improve Ability to disclose and discuss suicidal ideas Ability to demonstrate self-control will improve  Medication Management: Evaluate patient's response, side effects, and tolerance of medication regimen.  Therapeutic Interventions: 1 to 1 sessions, Unit Group sessions and Medication administration.  Evaluation of Outcomes: Adequate for Discharge  RN Treatment Plan for Primary Diagnosis: Bipolar I disorder, most recent episode depressed (HCC) Long Term Goal(s): Knowledge of disease and therapeutic regimen to maintain health will improve  Short Term Goals: Ability to identify and develop effective coping behaviors will improve and Compliance with prescribed medications will improve  Medication Management: RN will administer medications as ordered by provider, will assess and evaluate patient's response and provide education to patient for prescribed medication. RN will report any adverse and/or side effects to prescribing provider.  Therapeutic Interventions: 1 on 1 counseling sessions, Psychoeducation, Medication administration, Evaluate responses to treatment, Monitor vital signs and CBGs as ordered, Perform/monitor CIWA, COWS, AIMS and Fall Risk screenings as ordered, Perform wound care treatments as ordered.  Evaluation of Outcomes: Adequate for Discharge  LCSW Treatment Plan for Primary Diagnosis: Bipolar I disorder, most recent episode depressed (HCC) Long Term Goal(s): Safe transition to appropriate next level of care at discharge, Engage patient in therapeutic group addressing interpersonal concerns. Short Term Goals: Engage patient in aftercare planning with referrals and resources, Increase ability to appropriately verbalize feelings, Increase emotional regulation, Identify triggers associated with mental health/substance abuse issues and  Increase skills for wellness and recovery  Therapeutic Interventions: Assess for all discharge needs, 1 to 1 time with Social worker, Explore available resources and support systems, Assess for adequacy in community support network, Educate family and significant other(s) on suicide prevention, Complete Psychosocial Assessment, Interpersonal group therapy.  Evaluation of Outcomes: Adequate for Discharge  Progress in Treatment: Attending groups: Yes Participating in groups: Yes Taking medication as prescribed: Yes, MD continues to assess for medication changes as needed Toleration medication: Yes, no side effects reported at this time Family/Significant other contact made: No, CSW attempting contact with pt's parents. Patient understands diagnosis: Developing insight  Discussing patient identified problems/goals with staff: Yes Medical problems stabilized or resolved: Yes Denies suicidal/homicidal ideation: Yes Issues/concerns per patient self-inventory: None Other: N/Douglas  New problem(s) identified: None identified at this time.   New Short Term/Long Term Goal(s): None identified at this time.   Discharge Plan or Barriers: Pt will return home and follow up outpatient with [facility].  Reason for Continuation of Hospitalization: Depression Medication Stabilization  Estimated Length of Stay: 1 day; Estimated discharge date 11/1  Attendees: Patient: Stacie Douglas  04/01/2017 8:12 AM  Physician: Dr. Jama Flavors 04/01/2017 8:12 AM  Nursing: Vanessa Kick, RN 04/01/2017 8:12 AM  RN Care Manager: Onnie Boer, RN 04/01/2017 8:12 AM  Social Worker: Verdene Lennert, LCSW 04/01/2017 8:12 AM  Recreational Therapist:  04/01/2017 8:12 AM  Other: Armandina Stammer, NP; Reola Calkins, NP 04/01/2017 8:12 AM  Other:  04/01/2017 8:12 AM  Other: 04/01/2017 8:12 AM   Scribe for Treatment Team: Vernie Shanks, LCSW Clinical Social Work [phone]

## 2017-04-01 NOTE — Progress Notes (Signed)
D:Pt rates depression as a 6 and anxiety as a 4 on 0-10 scale with 10 being the most. Pt met with her treatment team today and reported passive si thoughts. She is worried about returning home and talked about going to "my mother's mother," that lives out of state and could not recall what state that she lives in. Pt reports to Probation officer that her parents often fight and drink. She said that she thought about living with her sister but her sister said no. Pt is in home school at this time.  A:Offered support, encouragement and 15 minute checks. R:Pt contracts for safety. Safety maintained on the unit.

## 2017-04-01 NOTE — Progress Notes (Signed)
Patient ID: Stacie Douglas, female   DOB: 04/22/1996, 21 y.o.   MRN: [id number]  Pt currently presents with an anxious affect and behavior. Pt requests to speak with writer tonight due to "thoughts of hurting myself." Pt states "I feel bad about what I did to my friends before I came here, I am worried that they won't forgive me." Pt has concerns about her sexuality and issues that she has been faced with recently. Pt reports that she is glad she has a diagnosis so she can learn more about Bipolar. Reports she wishes her father, who has bipolar as well, would get on medications as he has a tendency to get "violent" with her mother. Pt reports poor sleep with current medication regimen.   Pt provided with medications per providers orders. Pt's labs and vitals were monitored throughout the night. Pt given a 1:1 about emotional and mental status. Pt supported and encouraged to express concerns and questions. Pt encouraged to speak with CSW and MD about concerns outside of the psychoeducational realm.  Pt educated on medications, Bipolar diagnosis and assertiveness techniques.   Pt's safety ensured with 15 minute and environmental checks. Pt currently denies SI/HI and A/V hallucinations. Pt verbally agrees to seek staff if SI/HI or A/VH occurs and to consult with staff before acting on any harmful thoughts. Will continue POC.

## 2017-04-01 NOTE — Plan of Care (Signed)
Problem: Medication: Goal: Compliance with prescribed medication regimen will improve Outcome: Progressing Pt is taking medications as prescribed.   

## 2017-04-01 NOTE — Progress Notes (Signed)
Patient ID: Barrie FolkKaren Manso, female   DOB: 01/29/1996, 21 y.o.   MRN: [id number]  Pt currently presents with an anxious affect and restless behavior. Pt reports to writer that their goal is to "talk to my parents and make a plan for when I go home."Pt has concerns about living with her parents post discharge. Pt major concerns currently is how to tell her parents about her "gender fluidity" and that she wants to wear a "breast binder and more manly clothes to see how it feels." Pt reports good sleep with increase in dose of current sleep medication.  Pt provided with medications per providers orders. Pt's labs and vitals were monitored throughout the night. Pt given a 1:1 about emotional and mental status. Pt supported and encouraged to express concerns and questions. Pt educated on medications and assertiveness/communication tehcniques. Given handouts and worksheets, pt verbalizes understanding.   Pt's safety ensured with 15 minute and environmental checks. Pt currently denies SI/HI and A/V hallucinations. Pt verbally agrees to seek staff if SI/HI or A/VH occurs and to consult with staff before acting on any harmful thoughts. Will continue POC.

## 2017-04-01 NOTE — [facility] Group Notes (Signed)
[facility] Group Therapy 04/01/2017 1:15pm  Type of Therapy: Mental [employer] Presentation  Participation Level: Active  Participation Quality: Attentive  Affect: Appropriate  Cognitive: Oriented  Insight: Developing/Improving  Engagement in Therapy: Engaged  Modes of Intervention: Discussion, Education and Socialization  Summary of Progress/Problems: Mental Health Association (MHA) Speaker came to talk about his personal journey with mental health. The pt processed ways by which to relate to the speaker. MHA speaker provided handouts and educational information pertaining to groups and services offered by the MHA. Pt was engaged in speaker's presentation and was receptive to resources provided.    Kipling Graser C Jaxsin Bottomley, LCSW 04/01/2017 4:40 PM  

## 2017-04-01 NOTE — Progress Notes (Signed)
[facility] MD Progress Note  04/01/2017 2:47 PM Stacie Douglas  MRN:  [id number]   Subjective: patient reports she is feeling better, but that she continues to feel anxious. Although denies any suicidal ideations at this time , states she did have increased suicidal ideations last evening, although without specific plan or intention. She states that she was visited by family but is unsure if this led to increased anxiety and increased passive SI. She does states that family relationships are a stressor and describes feeling  that her parents are overprotective and over-involved in spite of her being 21 . States that they spoke about her moving in with another family member for a period of time, which she feels would be a good plan for her. Denies medication side effects- states she feels she was doing better on higher dose of Zyprexa ( dose was decreased from 5 mgrs QHS to 2.5 mgrs QHS yesterday due to AM dizziness ). States she feels she slept better and was less anxious at higher dose   Objective: I have discussed case with treatment team and have met with patient . Patient presents vaguely anxious, and today states she had increased anxious and suicidal ( passive SI, denies plan or intention) yesterday. She acknowledges some increased anxiety as she approaches discharge and describes family stressors as above. Today denies SI, and is future oriented, hoping she may be able to live with another family member after discharge. Today denies medication side effects. No disruptive or agitated behaviors on unit, going to groups. Labs reviewed as below.   Principal Problem: Bipolar I disorder, most recent episode depressed ([place])  Diagnosis:   Patient Active Problem List   Diagnosis Date Noted  . Bipolar I disorder, most recent episode depressed ([place]) [F31.30] 03/25/2017  . Acute respiratory failure ([place]) [J96.00] 03/22/2017  . Diphenhydramine overdose [T45.0X1A] 03/22/2017   Total Time spent with patient: 20  minutes  Past Psychiatric History: See H&P  Past Medical History:  Past Medical History:  Diagnosis Date  . GERD (gastroesophageal reflux disease)    History reviewed. No pertinent surgical history. Family History:  Family History  Problem Relation Age of Onset  . Autism Sister   . Autism Brother    Family Psychiatric  History: See H&P Social History:  History  Alcohol Use No     History  Drug Use No    Social History   Social History  . Marital status: Single    Spouse name: N/A  . Number of children: N/A  . Years of education: N/A   Social History Main Topics  . Smoking status: Never Smoker  . Smokeless tobacco: Never Used  . Alcohol use No  . Drug use: No  . Sexual activity: No   Other Topics Concern  . None   Social History Narrative  . None   Additional Social History:   Sleep: Good  Appetite:  Good  Current Medications: Current Facility-Administered Medications  Medication Dose Route Frequency Provider Last Rate Last Dose  . acetaminophen (TYLENOL) tablet 650 mg  650 mg Oral Q6H PRN Money, Lowry Ram, FNP   650 mg at 03/31/17 0959  . alum & mag hydroxide-simeth (MAALOX/MYLANTA) 200-200-20 MG/5ML suspension 30 mL  30 mL Oral Q4H PRN Rankin, Shuvon B, NP   30 mL at 04/01/17 0102  . feeding supplement (ENSURE ENLIVE) (ENSURE ENLIVE) liquid 237 mL  237 mL Oral TID BM Cobos, Myer Peer, MD   237 mL at 03/31/17 1951  . fluticasone (  FLONASE) 50 MCG/ACT nasal spray 1 spray  1 spray Each Nare Daily Money, Lowry Ram, FNP   1 spray at 04/01/17 0825  . magnesium hydroxide (MILK OF MAGNESIA) suspension 30 mL  30 mL Oral Daily PRN Rankin, Shuvon B, NP      . menthol-cetylpyridinium (CEPACOL) lozenge 3 mg  1 lozenge Oral PRN Rankin, Shuvon B, NP   3 mg at 03/27/17 2053  . OLANZapine (ZYPREXA) tablet 5 mg  5 mg Oral QHS Cobos, Fernando A, MD      . polyethylene glycol (MIRALAX / GLYCOLAX) packet 17 g  17 g Oral Daily Rankin, Shuvon B, NP      . polyvinyl alcohol  (LIQUIFILM TEARS) 1.4 % ophthalmic solution 1 drop  1 drop Both Eyes TID PRN Cobos, Myer Peer, MD   1 drop at 03/31/17 0958  . senna-docusate (Senokot-S) tablet 1 tablet  1 tablet Oral QHS PRN Rankin, Shuvon B, NP      . [START ON 04/02/2017] sertraline (ZOLOFT) tablet 75 mg  75 mg Oral Daily Cobos, Myer Peer, MD      . traZODone (DESYREL) tablet 50 mg  50 mg Oral QHS PRN Cobos, Myer Peer, MD   50 mg at 03/31/17 2257   Lab Results:  Results for orders placed or performed during the hospital encounter of 03/25/17 (from the past 48 hour(s))  CBC with Differential/Platelet     Status: Abnormal   Collection Time: 04/01/17  6:48 AM  Result Value Ref Range   WBC 7.4 4.0 - 10.5 K/uL   RBC 3.73 (L) 3.87 - 5.11 MIL/uL   Hemoglobin 11.4 (L) 12.0 - 15.0 g/dL   HCT 34.0 (L) 36.0 - 46.0 %   MCV 91.2 78.0 - 100.0 fL   MCH 30.6 26.0 - 34.0 pg   MCHC 33.5 30.0 - 36.0 g/dL   RDW 13.7 11.5 - 15.5 %   Platelets 270 150 - 400 K/uL   Neutrophils Relative % 55 %   Neutro Abs 4.0 1.7 - 7.7 K/uL   Lymphocytes Relative 38 %   Lymphs Abs 2.8 0.7 - 4.0 K/uL   Monocytes Relative 5 %   Monocytes Absolute 0.4 0.1 - 1.0 K/uL   Eosinophils Relative 2 %   Eosinophils Absolute 0.2 0.0 - 0.7 K/uL   Basophils Relative 0 %   Basophils Absolute 0.0 0.0 - 0.1 K/uL    Comment: Performed at [facility], [place] [address], [place], [place] [address]  Basic metabolic panel     Status: None   Collection Time: 04/01/17  6:48 AM  Result Value Ref Range   Sodium 137 135 - 145 mmol/L   Potassium 3.8 3.5 - 5.1 mmol/L   Chloride 102 101 - 111 mmol/L   CO2 22 22 - 32 mmol/L   Glucose, Bld 72 65 - 99 mg/dL   BUN 10 6 - 20 mg/dL   Creatinine, Ser 0.58 0.44 - 1.00 mg/dL   Calcium 8.9 8.9 - 10.3 mg/dL   GFR calc non Af Amer >60 >60 mL/min   GFR calc Af Amer >60 >60 mL/min    Comment: (NOTE) The eGFR has been calculated using the CKD EPI equation. This calculation has not been validated in all clinical  situations. eGFR's persistently <60 mL/min signify possible Chronic Kidney Disease.    Anion gap 13 5 - 15    Comment: Performed at [facility], [place] [address], [place], [place] [address]    Blood Alcohol  level:  Lab Results  Component Value Date   ETH <10 03/22/2017   ETH <5 17/00/1749   Metabolic Disorder Labs: No results found for: HGBA1C, MPG No results found for: PROLACTIN Lab Results  Component Value Date   TRIG 40 03/22/2017   Physical Findings: AIMS: Facial and Oral Movements Muscles of Facial Expression: None, normal Lips and Perioral Area: None, normal Jaw: None, normal Tongue: None, normal,Extremity Movements Upper (arms, wrists, hands, fingers): None, normal Lower (legs, knees, ankles, toes): None, normal, Trunk Movements Neck, shoulders, hips: None, normal, Overall Severity Severity of abnormal movements (highest score from questions above): None, normal Incapacitation due to abnormal movements: None, normal Patient's awareness of abnormal movements (rate only patient's report): No Awareness, Dental Status Current problems with teeth and/or dentures?: No Does patient usually wear dentures?: No  CIWA:  CIWA-Ar Total: 1 COWS:  COWS Total Score: 2  Musculoskeletal: Strength & Muscle Tone: within normal limits Gait & Station: normal Patient leans: N/A  Psychiatric Specialty Exam: Physical Exam  Nursing note and vitals reviewed. Constitutional: She is oriented to person, place, and time. She appears well-developed and well-nourished.  Respiratory: Effort normal.  Musculoskeletal: Normal range of motion.  Neurological: She is alert and oriented to person, place, and time.  Skin: Skin is warm.    Review of Systems  Constitutional: Negative.   HENT: Negative.   Respiratory: Negative.   Cardiovascular: Negative.   Gastrointestinal: Negative.   Genitourinary: Negative.   Musculoskeletal: Negative.   Skin: Negative.   Neurological:  Negative.   Endo/Heme/Allergies: Negative.   Psychiatric/Behavioral: Positive for depression and suicidal ideas (this morning right after waking up but resolved). Negative for hallucinations. The patient is not nervous/anxious.   AM dizziness,lightheadedness, currently improved. No chest pain, no shortness of breath. No vomiting.  Blood pressure (!) 102/54, pulse (!) 110, temperature 98.5 F (36.9 C), temperature source Oral, resp. rate 16, height _0  (1.651 m), weight 39.9 kg (88 lb), SpO2 100 %.Body mass index is 14.64 kg/m.  General Appearance: better groomed   Eye Contact:  Good  Speech:  Normal Rate  Volume:  Normal  Mood:  improved compared to admission, remains vaguely depressed, anxious   Affect:  anxious, but reactive, and brightens during session  Thought Process:  Linear and Descriptions of Associations: Intact  Orientation:  Other:  fully alert and attentive   Thought Content:  no hallucinations, no delusions, not internally preoccupied   Suicidal Thoughts:  No reports some passive SI yesterday evening but today denies SI and is future oriented , denies self injurious ideations  Homicidal Thoughts:  No denies homicidal or violent ideations  Memory:  Immediate;   Good Recent;   Good Remote;   Good  Judgement:  Improving   Insight:  improving   Psychomotor Activity:  Normal  Concentration:  Concentration: Good and Attention Span: Good  Recall:  Good  Fund of Knowledge:  Good  Language:  Good  Akathisia:  No  Handed:  Right  AIMS (if indicated):     Assets:  Communication Skills Desire for Improvement Financial Resources/Insurance Housing Physical Health Social Support Transportation  ADL's:  Intact  Cognition:  WNL  Sleep:  Number of Hours: 5.25   Assessment- patient presents with improving mood and range of affect. Presents with some anxiety as she approaches discharge and disposition planning and describes family interactions as a stressor, states she feels  overprotected . She states she had increased passive SI yesterday evening but denies any at  this time and contracts for safety. Denies medication side effects at present, and requests to increase Zyprexa back to prior dose as she felt it was more effective .  Treatment Plan Summary: Treatment Plan reviewed as below today 10/31 Daily contact with patient to assess and evaluate symptoms and progress in treatment, Medication management and Plan is to:   Encourage group and milieu participation to work on coping skills and symptom reduction   -Increase Zyprexa to 5 mg PO Q HS for mood disorder  -Increase  Zoloft to 75  mg PO Daily for depression/anxiety   -Continue Trazodone 50 mg PO Q HS PRN for insomnia  -Treatment Team working on disposition planning options     Jenne Campus, MD 04/01/2017, 2:47 PM   Patient ID: Stacie Douglas, female   DOB: 05/06/1996, 21 y.o.   MRN: [id number]

## 2017-04-02 MED ORDER — SERTRALINE HCL 25 MG PO TABS: 75.0000 mg | ORAL_TABLET | Freq: Every day | ORAL | 0 refills | Status: AC

## 2017-04-02 MED ORDER — TRAZODONE HCL 100 MG PO TABS: 100.0000 mg | ORAL_TABLET | Freq: Every evening | ORAL | 0 refills | Status: AC | PRN

## 2017-04-02 MED ORDER — OLANZAPINE 5 MG PO TABS: 5.0000 mg | ORAL_TABLET | Freq: Every day | ORAL | 0 refills | Status: AC

## 2017-04-02 NOTE — Discharge Summary (Signed)
Physician Discharge Summary Note  Patient:  Stacie Douglas is an 21 y.o., female MRN:  [id number] DOB:  02/26/1996 Patient phone:  [phone] (home)  Patient address:   [address] [place] [address],  Total Time spent with patient: 20 minutes  Date of Admission:  03/25/2017 Date of Discharge: 04/02/17   Reason for Admission:  Worsening depression with intentional drug overdose  Principal Problem: Bipolar I disorder, most recent episode depressed [facility](HCC) Discharge Diagnoses: Patient Active Problem List   Diagnosis Date Noted  . Bipolar I disorder, most recent episode depressed (HCC) [F31.30] 03/25/2017  . Acute respiratory failure (HCC) [J96.00] 03/22/2017  . Diphenhydramine overdose [T45.0X1A] 03/22/2017    Past Psychiatric History: [facility] admission 1 year ago for severe paranoia and delusional thoughts, no medications  Past Medical History:  Past Medical History:  Diagnosis Date  . GERD (gastroesophageal reflux disease)    History reviewed. No pertinent surgical history. Family History:  Family History  Problem Relation Age of Onset  . Autism Sister   . Autism Brother    Family Psychiatric  History: 1 brother and 1 sister with Autism Social History:  History  Alcohol Use No     History  Drug Use No    Social History   Social History  . Marital status: Single    Spouse name: N/A  . Number of children: N/A  . Years of education: N/A   Social History Main Topics  . Smoking status: Never Smoker  . Smokeless tobacco: Never Used  . Alcohol use No  . Drug use: No  . Sexual activity: No   Other Topics Concern  . None   Social History Narrative  . None    Hospital Course:   03/23/17 Psych Consult: 21 years old female admitted to the hospital for intentional drug overdose as a suicide attempt and being found unresponsive inthe floor reportedly patient has taken intentional overdose of sleeping pills (benadryl) unknown amount with intention to end her  life. Patient reported she has been depressed on and off for the last one year and also extremely stressed about her home environment. Patient reported she has been home schooled secondary to multiple the locations in the past and reportedly she has it to younger siblings who has autism. Patient reported she has been smoking cigars once a week and also drinks alcohol more like a wind course. Patient stated her grades are okay. Patient has an disagreement with her boyfriend Rickey who is telling her not to go back with her other friends which is a disagreement between them.Patient urine drug screen is negative for drug of abuse and blood alcohol is not significant. Staff RN reported patient was intubated on arrival and she was extubated today. Patient stated she just woke up she could not talk to any family members. Patient has no family members in the hospital at thetime of the evaluation. Based on my evaluation patient meets criteria for acute psychiatric hospitalization when medically stable.  03/25/17 [facility] RN Assessment: She was admitted medically for OD on benadryl requiring intubation. She reported that she was feeling overwhelmed having to deal with autistic siblings, recent break up with controlling boyfriend and losing some of her friends because of him. She reported history of one psychiatric hospitalization December 2017 due to issues with that same boyfriend. [facility], she reported that she is still having some depression but is remorseful of the attempt. She currently denies suicidal ideation and will contract for safety on the  unit. She denies HI or A/V hallucinations. She denies any medical issues and appears to be in no physical distress.  03/26/17 [facility] MD Assessment: Patient reports that she had planned this event for a while. SH estates that she had researched it on the internet and asked her friends what was a painless death. She reports that it was a calm day at home and  her parents weren't arguing so everyone was in their room and she had time to go get her mom's Benadryl Allergy and took the whole bottle and drank a wine cooler with it. She reports that then she woke up in the hospital. 1 year ago she reports having delusional thoughts and paranoia that her parents were going to kill her and sell her organs on the black market. She ran from her parents and that was when they sent her to [facility]. She reports that [facility] wanted to start her on Zyprexa but she refused.  She reports some minor paranoia from time to time, but nothing as extreme as 1 year ago.  She reports that she has used marijuana to help keep her calm, however, her UDS is negative. She rates her depression at 3/10 and anxiety at 2/10. She denies any current SI/HI/AVH or paranoia. Just to note RN that admitted her last night reported that patient told a different story and denied any previous suicide attempts. Patient did mention that her parents fight a lot, there is concern if there is some personality disorder as well.  Patient remained on the [facility] unit for 6 days and stabilized with medications and therapy. Patient was started on Zyprexa and titrated to 5 mg QHS and Zoloft titrated to 75 mg Daily. Patient showed improvement with improved mood, affect, sleep, appetite, and interaction. Patient has been attending group and participating. Patient has shown significant improvement, but patient seems to be influenced easily by others. Patient has continued to deny SI/HI/AVH and contracts for safety. Patient agrees to follow up with outpatient providers at [facility]. Patient is provided with prescriptions for medications upon discharge.    Physical Findings: AIMS: Facial and Oral Movements Muscles of Facial Expression: None, normal Lips and Perioral Area: None, normal Jaw: None, normal Tongue: None, normal,Extremity Movements Upper (arms, wrists, hands, fingers): None, normal Lower (legs, knees,  ankles, toes): None, normal, Trunk Movements Neck, shoulders, hips: None, normal, Overall Severity Severity of abnormal movements (highest score from questions above): None, normal Incapacitation due to abnormal movements: None, normal Patient's awareness of abnormal movements (rate only patient's report): No Awareness, Dental Status Current problems with teeth and/or dentures?: No Does patient usually wear dentures?: No  CIWA:  CIWA-Ar Total: 1 COWS:  COWS Total Score: 2  Musculoskeletal: Strength & Muscle Tone: within normal limits Gait & Station: normal Patient leans: N/A  Psychiatric Specialty Exam: Physical Exam  Nursing note and vitals reviewed. Constitutional: She is oriented to person, place, and time. She appears well-developed and well-nourished.  Respiratory: Effort normal.  Musculoskeletal: Normal range of motion.  Neurological: She is alert and oriented to person, place, and time.  Skin: Skin is warm.    Review of Systems  Constitutional: Negative.   HENT: Negative.   Eyes: Negative.   Respiratory: Negative.   Cardiovascular: Negative.   Gastrointestinal: Negative.   Genitourinary: Negative.   Musculoskeletal: Negative.   Skin: Negative.   Neurological: Negative.   Endo/Heme/Allergies: Negative.   Psychiatric/Behavioral: Negative.     Blood pressure 105/62, pulse (!) 120, temperature 98.4 F (  36.9 C), resp. rate 16, height 5\' 5"  (1.651 m), weight 47.6 kg (105 lb), SpO2 100 %.Body mass index is 17.47 kg/m.  General Appearance: Casual  Eye Contact:  Good  Speech:  Clear and Coherent and Normal Rate  Volume:  Normal  Mood:  Euthymic  Affect:  Appropriate  Thought Process:  Goal Directed and Descriptions of Associations: Intact  Orientation:  Full (Time, Place, and Person)  Thought Content:  WDL  Suicidal Thoughts:  No  Homicidal Thoughts:  No  Memory:  Immediate;   Good Recent;   Good Remote;   Good  Judgement:  Fair  Insight:  Fair  Psychomotor  Activity:  Normal  Concentration:  Concentration: Good and Attention Span: Good  Recall:  Good  Fund of Knowledge:  Fair  Language:  Good  Akathisia:  No  Handed:  Right  AIMS (if indicated):     Assets:  Desire for Improvement Financial Resources/Insurance Housing Physical Health Social Support Transportation  ADL's:  Intact  Cognition:  WNL  Sleep:  Number of Hours: 6.5     Have you used any form of tobacco in the last 30 days? (Cigarettes, Smokeless Tobacco, Cigars, and/or Pipes): Yes  Has this patient used any form of tobacco in the last 30 days? (Cigarettes, Smokeless Tobacco, Cigars, and/or Pipes) Yes, Yes, A prescription for an FDA-approved tobacco cessation medication was offered at discharge and the patient refused  Blood Alcohol level:  Lab Results  Component Value Date   ETH <10 03/22/2017   ETH <5 05/22/2016    Metabolic Disorder Labs:  No results found for: HGBA1C, MPG No results found for: PROLACTIN Lab Results  Component Value Date   TRIG 40 03/22/2017    See Psychiatric Specialty Exam and Suicide Risk Assessment completed by Attending Physician prior to discharge.  Discharge destination:  Home  Is patient on multiple antipsychotic therapies at discharge:  No   Has Patient had three or more failed trials of antipsychotic monotherapy by history:  No  Recommended Plan for Multiple Antipsychotic Therapies: NA   Allergies as of 04/02/2017   No Known Allergies     Medication List    STOP taking these medications   ibuprofen 200 MG tablet Commonly known as:  ADVIL,MOTRIN   OVER THE COUNTER MEDICATION     TAKE these medications     Indication  OLANZapine 5 MG tablet Commonly known as:  ZYPREXA Take 1 tablet (5 mg total) by mouth at bedtime. For mood control  Indication:  Major Depressive Disorder, mood stability   polyethylene glycol packet Commonly known as:  MIRALAX / GLYCOLAX Take 17 g by mouth daily.  Indication:  Constipation    sertraline 25 MG tablet Commonly known as:  ZOLOFT Take 3 tablets (75 mg total) by mouth daily. For mood control  Indication:  mood stability   traZODone 100 MG tablet Commonly known as:  DESYREL Take 1 tablet (100 mg total) by mouth at bedtime as needed for sleep.  Indication:  Trouble Sleeping      Follow-up Information    [facility] Follow up on 04/02/2017.   Why:  Hospital discharge appointment 11/1 at 2pm. Please call the office if you need to cancel or reschedule the appointment.  Contact information: [address] [place] [address] [phone]           Follow-up recommendations:  Continue activity as tolerated. Continue diet as recommended by your PCP. Ensure to keep all appointments with outpatient providers.  Comments:  Patient is instructed prior to discharge to: Take all medications as prescribed by his/her mental healthcare provider. Report any adverse effects and or reactions from the medicines to his/her outpatient provider promptly. Patient has been instructed & cautioned: To not engage in alcohol and or illegal drug use while on prescription medicines. In the event of worsening symptoms, patient is instructed to call the crisis hotline, 911 and or go to the nearest ED for appropriate evaluation and treatment of symptoms. To follow-up with his/her primary care provider for your other medical issues, concerns and or health care needs.    Signed: Gerlene Burdock Money, FNP 04/02/2017, 10:37 AM   Patient seen, Suicide Assessment Completed.  Disposition Plan Reviewed

## 2017-04-02 NOTE — Progress Notes (Signed)
  [facility] Discharge Plan :  Will you be returning to the same living situation after discharge:  Yes,  Pt returning home with family At discharge, do you have transportation home?: Yes,  Pt mother to pick up Do you have the ability to pay for your medications: Yes,  Pt provided with prescriptions  Release of information consent forms completed and in the chart;  Patient's signature needed at discharge.  Patient to Follow up at: Follow-up Information    [facility] Follow up on 04/02/2017.   Why:  Hospital discharge appointment 11/1 at 2pm. Please call the office if you need to cancel or reschedule the appointment.  Contact information: [address] [place] [address] [phone]           Next level of care provider has access to [facility] Link:no  Safety Planning and Suicide Prevention discussed: Yes,  with Pt; 2 unsuccessful attempts with parents  Have you used any form of tobacco in the last 30 days? (Cigarettes, Smokeless Tobacco, Cigars, and/or Pipes): Yes  Has patient been referred to the Quitline?: Patient refused referral  Patient has been referred for addiction treatment: Yes  Verdene LennertLauren C Latoia Eyster, LCSW 04/02/2017, 9:35 AM

## 2017-04-02 NOTE — [facility] Suicide Risk Assessment (Addendum)
[facility]   Principal Problem: Bipolar I disorder, most recent episode depressed [facility](HCC) Discharge Diagnoses:  Patient Active Problem List   Diagnosis Date Noted  . Bipolar I disorder, most recent episode depressed (HCC) [F31.30] 03/25/2017  . Acute respiratory failure (HCC) [J96.00] 03/22/2017  . Diphenhydramine overdose [T45.0X1A] 03/22/2017    Total Time spent with patient: 30 minutes  Musculoskeletal: Strength & Muscle Tone: within normal limits Gait & Station: normal Patient leans: N/A  Psychiatric Specialty Exam: ROS denies headache, no chest pain, no shortness of breath, no vomiting , no nausea   Blood pressure 105/62, pulse (!) 120, temperature 98.4 F (36.9 C), resp. rate 16, height 5\' 5"  (1.651 m), weight 47.6 kg (105 lb), SpO2 100 %.Body mass index is 17.47 kg/m.  General Appearance: improved grooming   Eye Contact::  Good  Speech:  Normal Rate409  Volume:  Normal  Mood:  reports improving mood, less depressed  Affect:  Appropriate and improved, less anxious   Thought Process:  Linear and Descriptions of Associations: Intact  Orientation:  Full (Time, Place, and Person)  Thought Content:  denies hallucinations, no delusions, not internally preoccupied   Suicidal Thoughts:  No denies any suicidal or self injurious ideations, no violent or homicidal ideations  Homicidal Thoughts:  No  Memory:  recent and remote grossly intact   Judgement:  Other:  improving   Insight:  improving   Psychomotor Activity:  Normal  Concentration:  Good  Recall:  Good  Fund of Knowledge:Good  Language: Good  Akathisia:  Negative  Handed:  Right  AIMS (if indicated):    no abnormal or involuntary movements noted or reported  Assets:  Communication Skills Desire for Improvement Resilience  Sleep:  Number of Hours: 6.5  Cognition: WNL  ADL's:  Intact   Mental Status Per Nursing Assessment::   On Admission:  Self-harm behaviors  Demographic Factors:   21 year old single female, lives with parents, currently working on GED   Loss Factors: Family stressors, recent break up   Historical Factors: [facility] for psychotic decompensation. History of depression.   Risk Reduction Factors:   Sense of responsibility to family, Living with another person, especially a relative and Positive coping skills or problem solving skills  Continued Clinical Symptoms:  Alert and attentive, well related, mood improved, affect improved, less anxious, no thought disorder, no suicidal or self injurious ideations, no homicidal ideations, no hallucinations, no delusions, future oriented. At this time is future oriented, and states she has been thinking she would like to eventually become an LPN or Charity fundraiserN .  Denies medication side effects,except for palpitations at times . Does feel medications are helping . Side effects discussed , including risk of sedation, metabolic, movement disturbances . She is also aware of potential increase in suicidal ideations early in treatment with antidepressants in young adults .   Cognitive Features That Contribute To Risk:  No gross cognitive deficits noted upon discharge. Is alert , attentive, and oriented x 3   Suicide Risk:  Mild:  Suicidal ideation of limited frequency, intensity, duration, and specificity.  There are no identifiable plans, no associated intent, mild dysphoria and related symptoms, good self-control (both objective and subjective assessment), few other risk factors, and identifiable protective factors, including available and accessible social support.  Follow-up Information    [facility] Follow up on 04/02/2017.   Why:  Hospital discharge appointment 11/1 at 2pm. Please  call the office if you need to cancel or reschedule the appointment.  Contact information: [address] [place] [address] [phone]           Plan Of Care/Follow-up  recommendations:  Activity:  as tolerated  Diet:  Regular Tests:  NA Other:  See below  Patient expresses readiness for discharge Plans to return home Follow up as above   Craige Cotta, MD 04/02/2017, 10:06 AM

## 2017-04-02 NOTE — Progress Notes (Signed)
Pt. Discharged per MD orders;  PT. Currently denies any HI/SI or AVH.  Pt. Was given education regarding follow up appointments and medications by RN.  Pt. Denies any questions or concerns about the medications.  Pt. Was escorted to the search room to retrieve her belongings by RN before being discharged to the hospital lobby.  

## 2017-04-02 NOTE — [facility] Suicide Risk Assessment (Signed)
[facility] INPATIENT:  Family/Significant Other Suicide Prevention Education  Suicide Prevention Education:  Education Completed; Santo HeldJacquelyn Diener, Pt's mother [phone], has been identified by the patient as the family member/significant other with whom the patient will be residing, and identified as the person(s) who will aid the patient in the event of a mental health crisis (suicidal ideations/suicide attempt).  With written consent from the patient, the family member/significant other has been provided the following suicide prevention education, prior to the and/or following the discharge of the patient.  The suicide prevention education provided includes the following:  Suicide risk factors  Suicide prevention and interventions  National Suicide Hotline telephone number  [facility] assessment telephone number  [facility] Emergency Assistance 911  [facility] and/or Residential Mobile Crisis Unit telephone number  Request made of family/significant other to:  Remove weapons (e.g., guns, rifles, knives), all items previously/currently identified as safety concern.    Remove drugs/medications (over-the-counter, prescriptions, illicit drugs), all items previously/currently identified as a safety concern.  The family member/significant other verbalizes understanding of the suicide prevention education information provided.  The family member/significant other agrees to remove the items of safety concern listed above.  Verdene LennertLauren C Nusaiba Guallpa 04/02/2017, 10:15 AM

## 2019-02-27 IMAGING — CT CT HEAD W/O CM
3 series · 15 of 45 positions shown, 18 images · non-contrast
Comparison: None.

CLINICAL DATA: Patient unresponsive to painful stimuli. Found
unconscious earlier today.

EXAM:
CT HEAD WITHOUT CONTRAST
TECHNIQUE: Contiguous axial images were obtained from the base of the skull
through the vertex without intravenous contrast.

[Series 2: head wo · axial · 0.40mm/px · z∈[-23,+92]mm · 9 of 28 slices shown, 12 images]
[im 3/28  brain]
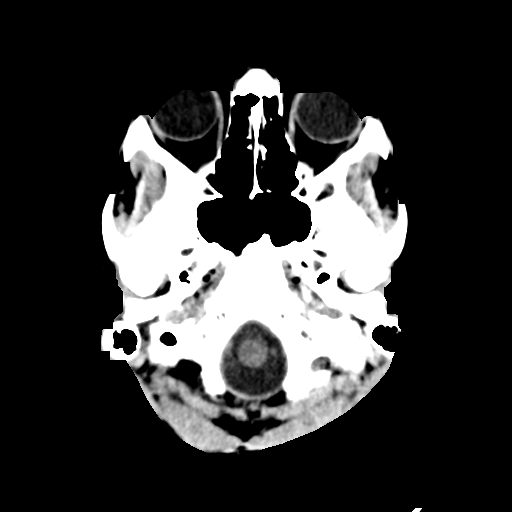
[im 3/28  bone]
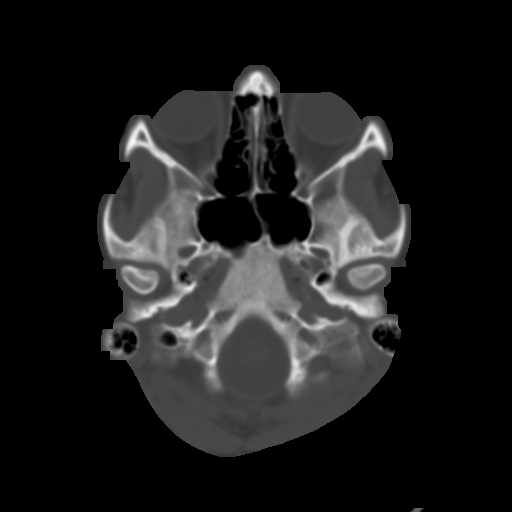
[im 6/28  brain]
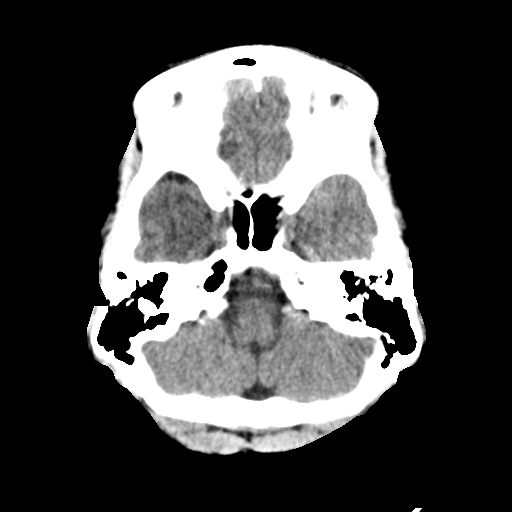
[im 9/28  brain]
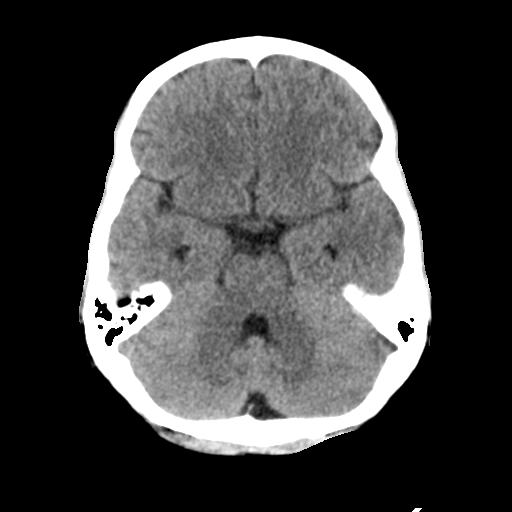
[im 12/28  brain]
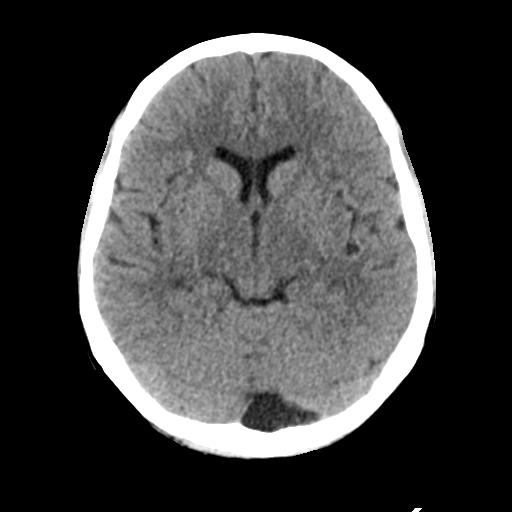
[im 15/28  brain]
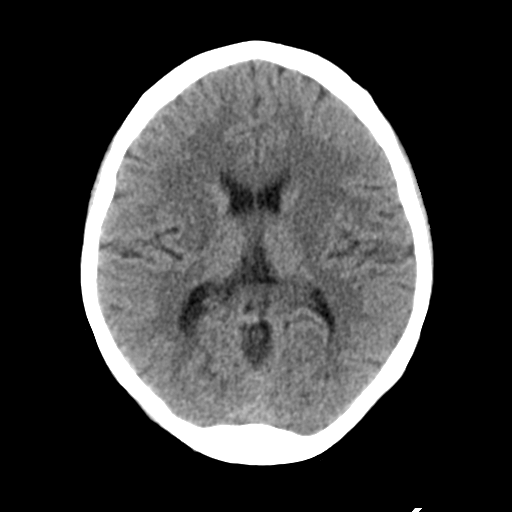
[im 15/28  bone]
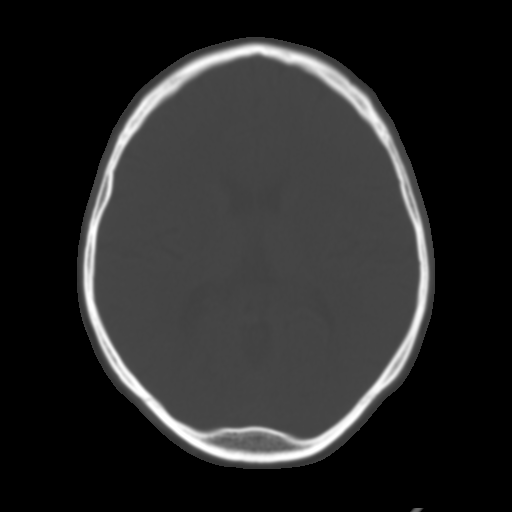
[im 17/28  brain]
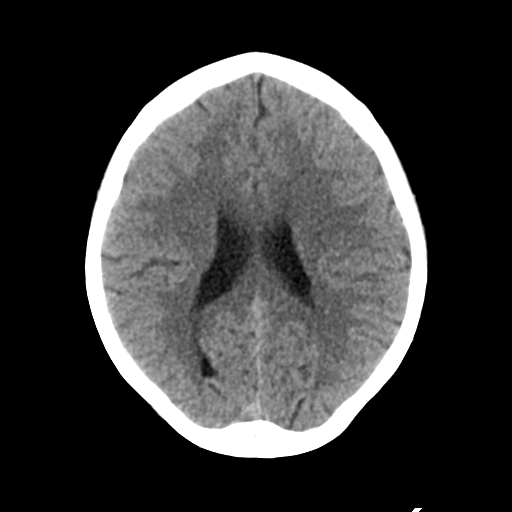
[im 20/28  brain]
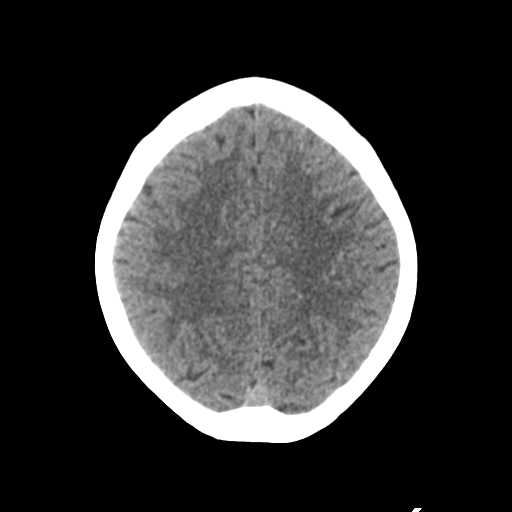
[im 23/28  brain]
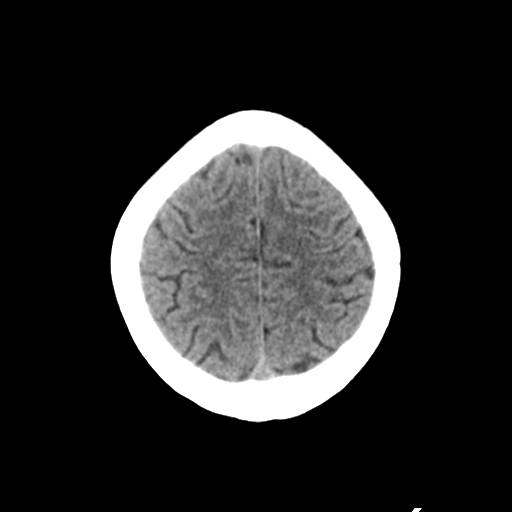
[im 26/28  brain]
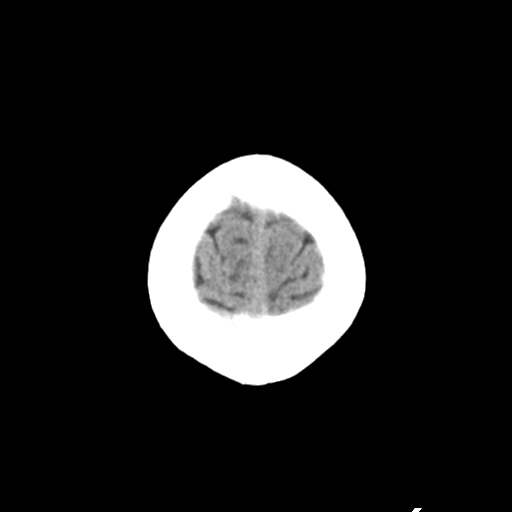
[im 26/28  bone]
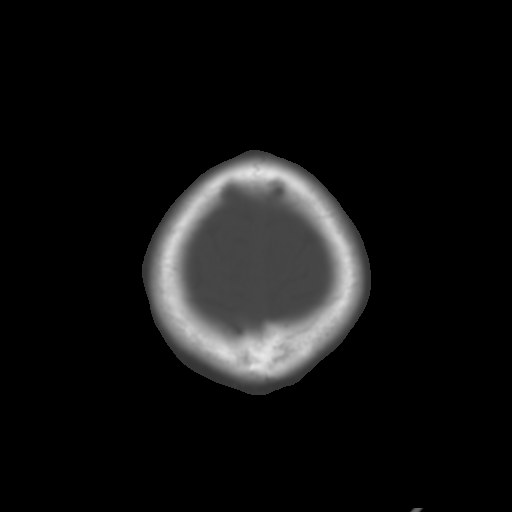

[Series 4: coronal soft tissue · coronal · 0.32mm/px · 3 of 67 slices shown]
[im 23/67  brain]
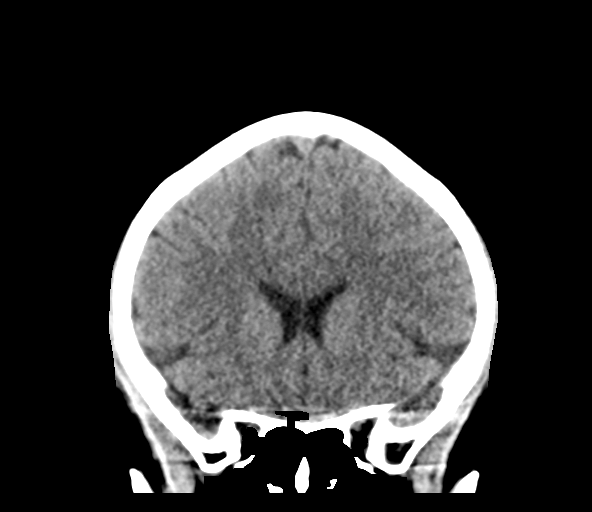
[im 30/67  brain]
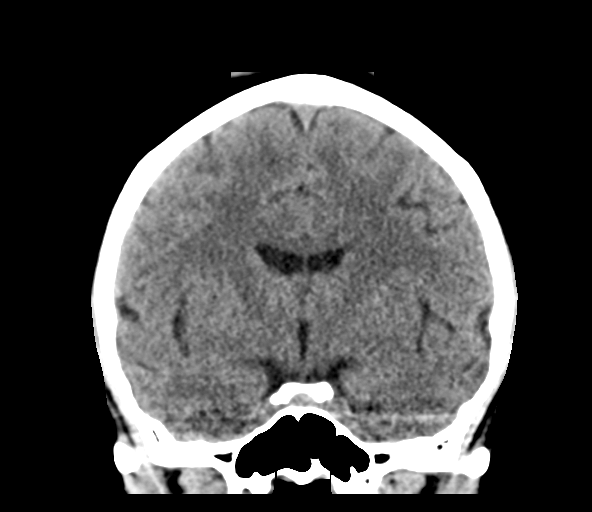
[im 37/67  brain]
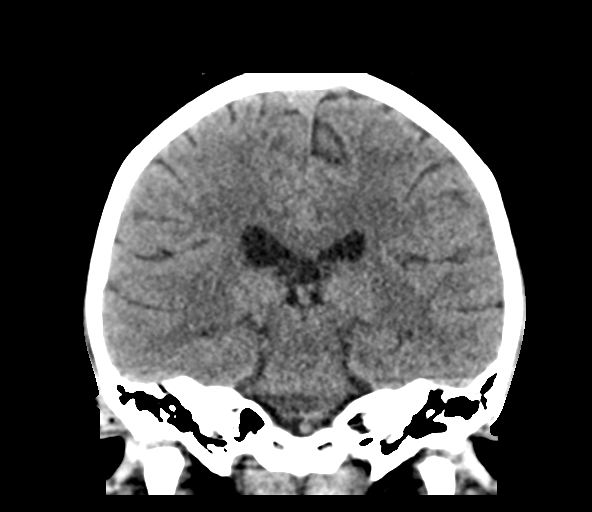

[Series 5: sagittal soft tissue · sagittal · 0.32mm/px · 3 of 60 slices shown]
[im 20/60  brain]
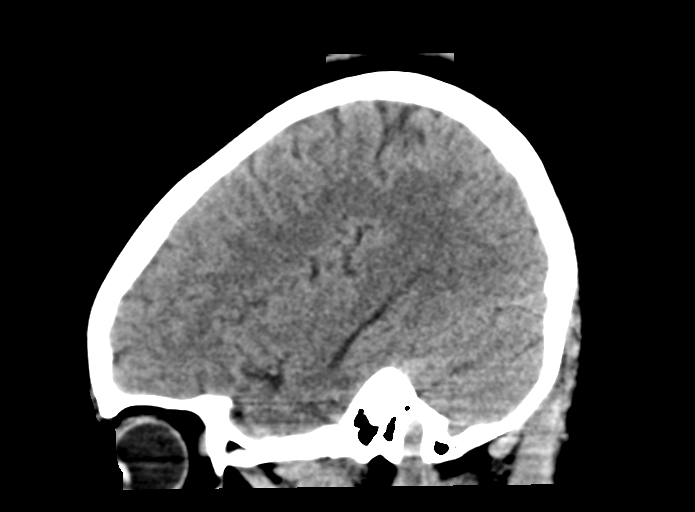
[im 30/60  brain]
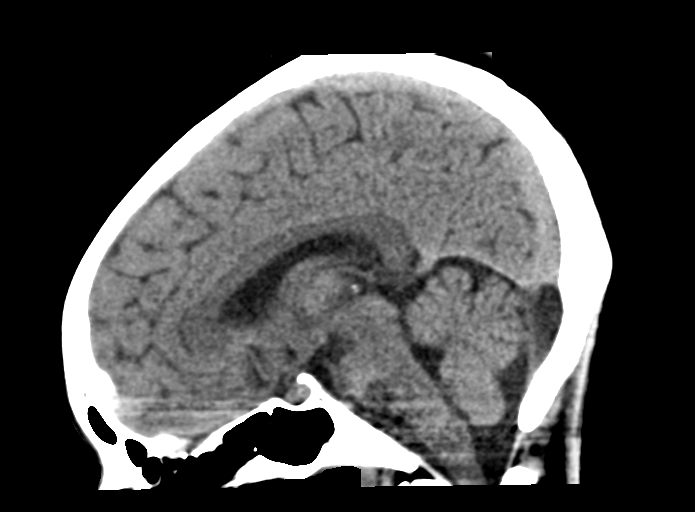
[im 40/60  brain]
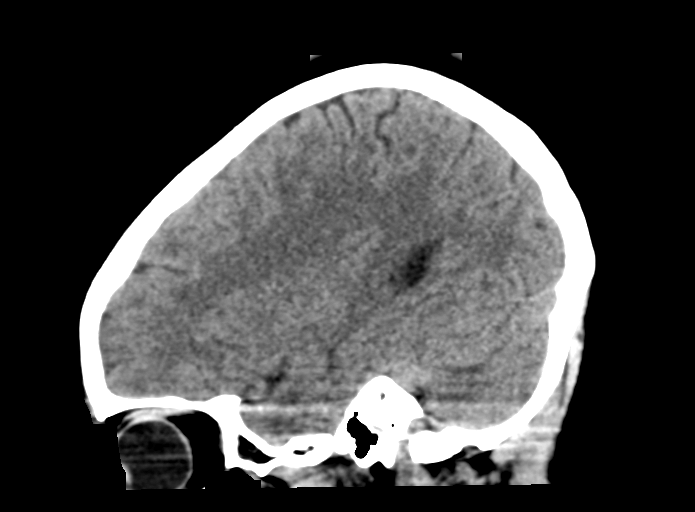

[15 of 45 positions shown; findings below may reference images not displayed]

FINDINGS: Brain: No evidence for acute infarction, hemorrhage, mass lesion,
hydrocephalus, or extra-axial fluid. Normal cerebral volume. No
white matter disease. Incidental mega cisterna magna versus
retrocerebellar arachnoid cyst, non worrisome.

Vascular: No hyperdense vessel or unexpected calcification.

Skull: Normal. Negative for fracture or focal lesion. Bony
remodeling in the region of the posterior fossa CSF collection
indicates chronicity.

Sinuses/Orbits: No acute finding.

Other: None.
IMPRESSION: Negative exam. No cause is seen for the reported altered level of
consciousness.
